# Patient Record
Sex: Female | Born: 1935 | Race: White | Hispanic: No | Smoking: Never smoker
Health system: Southern US, Community
[De-identification: ages and names within clinical notes are randomized; demographics above are authoritative.]

## PROBLEM LIST (undated history)

## (undated) DIAGNOSIS — G35 Multiple sclerosis: Secondary | ICD-10-CM

## (undated) DIAGNOSIS — I10 Essential (primary) hypertension: Secondary | ICD-10-CM

## (undated) DIAGNOSIS — E039 Hypothyroidism, unspecified: Secondary | ICD-10-CM

## (undated) HISTORY — PX: VITRECTOMY: SHX106

---

## 2014-06-23 ENCOUNTER — Ambulatory Visit: Payer: Self-pay | Admitting: Ophthalmology

## 2014-06-23 DIAGNOSIS — I499 Cardiac arrhythmia, unspecified: Secondary | ICD-10-CM

## 2014-06-23 LAB — POTASSIUM: Potassium: 3.7 mmol/L (ref 3.5–5.1)

## 2014-07-08 ENCOUNTER — Ambulatory Visit: Payer: Self-pay | Admitting: Ophthalmology

## 2014-10-25 NOTE — Op Note (Signed)
PATIENT NAME:  Caitlin Colon, Caitlin Colon MR#:  [id number] DATE OF BIRTH:  August 04, 1919  DATE OF PROCEDURE:  07/08/2014  PREPROCEDURE DIAGNOSIS: Epiretinal membrane, right eye.   POSTPROCEDURE DIAGNOSIS: Epiretinal membrane with vitreomacular traction, right eye.   PROCEDURE: A 25-gauge pars plana vitrectomy with triamcinolone, ICG membrane peel, and partial air-fluid exchange with air in the right eye.   ANESTHESIA: MAC with block.   COMPLICATIONS: None.   BLOOD LOSS: Minimal.   DESCRIPTION OF PROCEDURE: The patient was evaluated in the clinic for an epiretinal membrane with vitreomacular traction on the right eye. Risks, benefits, alternatives, and complications were discussed with patient. The patient elected to proceed with vitrectomy and surgery with membrane peeling in the right eye. On the day of surgery, the right eye was marked. Any questions were answered in the holding area. The patient was then brought into the Operating Room and placed under monitored anesthesia care. Then 4 mL of a retrobulbar block consisting of 4% Xylocaine, 0.75% Sensorcaine, and 100 units of Hylenex was injected. The right eye was then prepped and draped in usual sterile fashion. A 25-gauge trocar system was used. The three trocars were placed in the usual position. The inferotemporal one was checked to make sure it was in the vitreous cavity before starting the infusion. A core vitrectomy was performed. The posterior hyaloid was not elevated and so maneuvers to elevate the hyaloid were attempted. The hyaloid was very adherent. Triamcinolone was injected at this point for better staining of the posterior hyaloid and the hyaloid was attempted to be elevated. The hyaloid was successfully elevated nasally; however, temporally over the macula there was still an area of attached hyaloid and so the macular contact lens and forceps were used then to elevated it off the macula. At this point, ICG was then used to stain the retinal surface  and the epiretinal membrane was peeled off the retinal surface. Attention was then drawn back to the periphery. At this point, the hyaloid was just past the arcades temporally and appeared to be down peripherally. A 360 degree peripheral vitrectomy was performed to shorten the vitreous as close as possible. The scleral depressor was used then to examine 360 degrees all the way out to the ora. There were no retinal tears or breaks found. At this point, a partial air-fluid exchange was performed. The eye was left with a partial air bubble. Trocars were removed and were all felt to be watertight. Pressure was good by palpation. Subconjunctival cefuroxime and dexamethasone was injected and the patient was patched and shielded and taken to the recovery area in stable condition.    ____________________________ Princella Pellegrini Duke Salvia, MD jdr:at D: 07/08/2014 09:47:46 ET T: 07/08/2014 14:20:44 ET JOB#: [id number]  cc: Shanda Bumps D. Duke Salvia, MD, <Dictator> Dartha Lodge MD ELECTRONICALLY SIGNED 07/15/2014 10:21

## 2015-12-23 ENCOUNTER — Encounter: Payer: Self-pay | Admitting: Emergency Medicine

## 2015-12-23 ENCOUNTER — Inpatient Hospital Stay
Admission: EM | Admit: 2015-12-23 | Discharge: 2015-12-25 | DRG: 439 | Disposition: A | Discharge status: Other Acute Inpatient Hospital | Payer: Medicare Other | Attending: Internal Medicine | Admitting: Internal Medicine

## 2015-12-23 ENCOUNTER — Emergency Department: Payer: Medicare Other

## 2015-12-23 DIAGNOSIS — E039 Hypothyroidism, unspecified: Secondary | ICD-10-CM | POA: Diagnosis present

## 2015-12-23 DIAGNOSIS — R7401 Elevation of levels of liver transaminase levels: Secondary | ICD-10-CM | POA: Diagnosis present

## 2015-12-23 DIAGNOSIS — R740 Nonspecific elevation of levels of transaminase and lactic acid dehydrogenase [LDH]: Secondary | ICD-10-CM | POA: Diagnosis present

## 2015-12-23 DIAGNOSIS — K858 Other acute pancreatitis without necrosis or infection: Secondary | ICD-10-CM

## 2015-12-23 DIAGNOSIS — K851 Biliary acute pancreatitis without necrosis or infection: Secondary | ICD-10-CM | POA: Diagnosis not present

## 2015-12-23 DIAGNOSIS — K831 Obstruction of bile duct: Secondary | ICD-10-CM

## 2015-12-23 DIAGNOSIS — R748 Abnormal levels of other serum enzymes: Secondary | ICD-10-CM | POA: Diagnosis present

## 2015-12-23 DIAGNOSIS — H9190 Unspecified hearing loss, unspecified ear: Secondary | ICD-10-CM | POA: Diagnosis present

## 2015-12-23 DIAGNOSIS — K859 Acute pancreatitis without necrosis or infection, unspecified: Secondary | ICD-10-CM | POA: Diagnosis present

## 2015-12-23 DIAGNOSIS — G35 Multiple sclerosis: Secondary | ICD-10-CM | POA: Diagnosis present

## 2015-12-23 DIAGNOSIS — I10 Essential (primary) hypertension: Secondary | ICD-10-CM | POA: Diagnosis present

## 2015-12-23 DIAGNOSIS — K8020 Calculus of gallbladder without cholecystitis without obstruction: Secondary | ICD-10-CM | POA: Diagnosis present

## 2015-12-23 DIAGNOSIS — K810 Acute cholecystitis: Secondary | ICD-10-CM

## 2015-12-23 DIAGNOSIS — K8062 Calculus of gallbladder and bile duct with acute cholecystitis without obstruction: Secondary | ICD-10-CM | POA: Diagnosis present

## 2015-12-23 HISTORY — DX: Multiple sclerosis: G35

## 2015-12-23 LAB — CBC
HEMATOCRIT: 39.9 % (ref 35.0–47.0)
Hemoglobin: 13.5 g/dL (ref 12.0–16.0)
MCH: 35.1 pg — AB (ref 26.0–34.0)
MCHC: 33.8 g/dL (ref 32.0–36.0)
MCV: 103.8 fL — AB (ref 80.0–100.0)
Platelets: 169 10*3/uL (ref 150–440)
RBC: 3.84 MIL/uL (ref 3.80–5.20)
RDW: 13 % (ref 11.5–14.5)
WBC: 8.3 10*3/uL (ref 3.6–11.0)

## 2015-12-23 LAB — COMPREHENSIVE METABOLIC PANEL
ALT: 261 U/L — ABNORMAL HIGH (ref 14–54)
AST: 461 U/L — AB (ref 15–41)
Albumin: 4.1 g/dL (ref 3.5–5.0)
Alkaline Phosphatase: 106 U/L (ref 38–126)
Anion gap: 12 (ref 5–15)
BILIRUBIN TOTAL: 2.6 mg/dL — AB (ref 0.3–1.2)
BUN: 27 mg/dL — AB (ref 6–20)
CO2: 25 mmol/L (ref 22–32)
Calcium: 9.2 mg/dL (ref 8.9–10.3)
Chloride: 99 mmol/L — ABNORMAL LOW (ref 101–111)
Creatinine, Ser: 0.89 mg/dL (ref 0.44–1.00)
GFR, EST NON AFRICAN AMERICAN: 54 mL/min — AB (ref >60)
Glucose, Bld: 111 mg/dL — ABNORMAL HIGH (ref 65–99)
Potassium: 4 mmol/L (ref 3.5–5.1)
Sodium: 136 mmol/L (ref 135–145)
Total Protein: 7.2 g/dL (ref 6.5–8.1)

## 2015-12-23 LAB — LIPASE, BLOOD: LIPASE: 1258 U/L — AB (ref 11–51)

## 2015-12-23 MED ORDER — ONDANSETRON HCL 4 MG/2ML IJ SOLN
4.0000 mg | Freq: Once | INTRAMUSCULAR | Status: AC
  Administered 2015-12-23: 4 mg via INTRAVENOUS
  Filled 2015-12-23: qty 2

## 2015-12-23 MED ORDER — DIATRIZOATE MEGLUMINE & SODIUM 66-10 % PO SOLN
30.0000 mL | Freq: Once | ORAL | Status: AC
  Administered 2015-12-23: 30 mL via ORAL

## 2015-12-23 MED ORDER — SODIUM CHLORIDE 0.9 % IV BOLUS (SEPSIS)
1000.0000 mL | Freq: Once | INTRAVENOUS | Status: AC
  Administered 2015-12-23: 1000 mL via INTRAVENOUS

## 2015-12-23 NOTE — ED Provider Notes (Signed)
[facility] Emergency Department Provider Note  Time seen: 8:36 PM  I have reviewed the triage vital signs and the nursing notes.   HISTORY  Chief Complaint Emesis and Nausea    HPI Caitlin Colon is a 82 y.o. female with a past medical history of MS who presents to the emergency department for nausea or vomiting. According to the patient and her daughter who is a nurse the patient had lunch today, was complaining of nausea afterwards. The patient states she was shaking and had one episode of vomiting. Patient states she actually fell conservatively better after vomiting. States it is been several days since her last bowel movement. Denies any abdominal pain. Does feel like her abdomen is more distended than normal. Denies any black or bloody stool. Denies any dysuria.     Past Medical History  Diagnosis Date  . Multiple sclerosis (HCC)     There are no active problems to display for this patient.   History reviewed. No pertinent past surgical history.  No current outpatient prescriptions on file.  Allergies Review of patient's allergies indicates no known allergies.  No family history on file.  Social History Social History  Substance Use Topics  . Smoking status: Never Smoker   . Smokeless tobacco: None  . Alcohol Use: No    Review of Systems Constitutional: Negative for fever. Cardiovascular: Negative for chest pain. Respiratory: Negative for shortness of breath. Gastrointestinal: Negative for abdominal pain. Positive for vomiting. Negative for diarrhea. Positive constipation. Genitourinary: Negative for dysuria Neurological: Negative for headache 10-point ROS otherwise negative.  ____________________________________________   PHYSICAL EXAM:  VITAL SIGNS: ED Triage Vitals  Enc Vitals Group     BP 12/23/15 1817 122/63 mmHg     Pulse Rate 12/23/15 1817 82     Resp --      Temp 12/23/15 1817 98.4 F (36.9 C)     Temp Source  12/23/15 1817 Oral     SpO2 12/23/15 1817 91 %     Weight 12/23/15 1817 112 lb (50.803 kg)     Height 12/23/15 1817 5' (1.524 m)     Head Cir --      Peak Flow --      Pain Score 12/23/15 1819 0     Pain Loc --      Pain Edu? --      Excl. in GC? --     Constitutional: Alert and oriented. Well appearing and in no distress. Eyes: Normal exam ENT   Head: Normocephalic and atraumatic   Mouth/Throat: Mucous membranes are moist. Cardiovascular: Normal rate, regular rhythm. No murmur Respiratory: Normal respiratory effort without tachypnea nor retractions. Breath sounds are clear and equal bilaterally. No wheezes/rales/rhonchi. Gastrointestinal: Soft and nontender. No distention.   Musculoskeletal: Nontender with normal range of motion in all extremities.  Neurologic:  Normal speech and language. No gross focal neurologic deficits  Skin:  Skin is warm, dry and intact.  Psychiatric: Mood and affect are normal.   ____________________________________________   RADIOLOGY  Ultrasound pending  ____________________________________________    INITIAL IMPRESSION / ASSESSMENT AND PLAN / ED COURSE  Pertinent labs & imaging results that were available during my care of the patient were reviewed by me and considered in my medical decision making (see chart for details).  Patient presents to the emergency department one episode of vomiting. Nausea. Denies abdominal pain. Patient states she has been shaking today. Patient's labs have resulted showing elevated lipase of 1258, elevated LFTs. Labs  are consistent with possible choledocholithiasis. We will proceed with an ultrasound to further evaluate. Patient is agreeable to plan.  Ultrasound pending, patient care signed out to oncoming physician.  ____________________________________________   FINAL CLINICAL IMPRESSION(S) / ED DIAGNOSES  Pancreatitis Choledocholithiasis   Minna Antis, MD 12/24/15 2256

## 2015-12-23 NOTE — ED Notes (Addendum)
C/O nausea after lunch today.  Also vomiting x 1 for large amount.  Also c/o shaking during episode.

## 2015-12-24 ENCOUNTER — Encounter: Payer: Self-pay | Admitting: Internal Medicine

## 2015-12-24 DIAGNOSIS — I10 Essential (primary) hypertension: Secondary | ICD-10-CM | POA: Diagnosis present

## 2015-12-24 DIAGNOSIS — E039 Hypothyroidism, unspecified: Secondary | ICD-10-CM | POA: Diagnosis present

## 2015-12-24 DIAGNOSIS — K810 Acute cholecystitis: Secondary | ICD-10-CM | POA: Diagnosis present

## 2015-12-24 DIAGNOSIS — K8062 Calculus of gallbladder and bile duct with acute cholecystitis without obstruction: Secondary | ICD-10-CM | POA: Diagnosis present

## 2015-12-24 DIAGNOSIS — R7401 Elevation of levels of liver transaminase levels: Secondary | ICD-10-CM | POA: Diagnosis present

## 2015-12-24 DIAGNOSIS — R740 Nonspecific elevation of levels of transaminase and lactic acid dehydrogenase [LDH]: Secondary | ICD-10-CM

## 2015-12-24 DIAGNOSIS — K8020 Calculus of gallbladder without cholecystitis without obstruction: Secondary | ICD-10-CM | POA: Diagnosis present

## 2015-12-24 DIAGNOSIS — G35 Multiple sclerosis: Secondary | ICD-10-CM | POA: Diagnosis present

## 2015-12-24 DIAGNOSIS — R748 Abnormal levels of other serum enzymes: Secondary | ICD-10-CM | POA: Diagnosis present

## 2015-12-24 DIAGNOSIS — K859 Acute pancreatitis without necrosis or infection, unspecified: Secondary | ICD-10-CM | POA: Diagnosis present

## 2015-12-24 DIAGNOSIS — H9190 Unspecified hearing loss, unspecified ear: Secondary | ICD-10-CM | POA: Diagnosis present

## 2015-12-24 DIAGNOSIS — K851 Biliary acute pancreatitis without necrosis or infection: Secondary | ICD-10-CM | POA: Diagnosis present

## 2015-12-24 LAB — URINALYSIS COMPLETE WITH MICROSCOPIC (ARMC ONLY)
Bilirubin Urine: NEGATIVE
Glucose, UA: NEGATIVE mg/dL
KETONES UR: NEGATIVE mg/dL
LEUKOCYTES UA: NEGATIVE
NITRITE: NEGATIVE
PH: 6 (ref 5.0–8.0)
PROTEIN: 30 mg/dL — AB
SPECIFIC GRAVITY, URINE: 1.012 (ref 1.005–1.030)

## 2015-12-24 LAB — CBC
HCT: 33.2 % — ABNORMAL LOW (ref 35.0–47.0)
Hemoglobin: 11.5 g/dL — ABNORMAL LOW (ref 12.0–16.0)
MCH: 35.1 pg — AB (ref 26.0–34.0)
MCHC: 34.6 g/dL (ref 32.0–36.0)
MCV: 101.5 fL — AB (ref 80.0–100.0)
PLATELETS: 158 10*3/uL (ref 150–440)
RBC: 3.27 MIL/uL — ABNORMAL LOW (ref 3.80–5.20)
RDW: 13.3 % (ref 11.5–14.5)
WBC: 14.7 10*3/uL — ABNORMAL HIGH (ref 3.6–11.0)

## 2015-12-24 LAB — COMPREHENSIVE METABOLIC PANEL
ALT: 320 U/L — ABNORMAL HIGH (ref 14–54)
AST: 378 U/L — ABNORMAL HIGH (ref 15–41)
Albumin: 3.4 g/dL — ABNORMAL LOW (ref 3.5–5.0)
Alkaline Phosphatase: 106 U/L (ref 38–126)
Anion gap: 8 (ref 5–15)
BILIRUBIN TOTAL: 3.6 mg/dL — AB (ref 0.3–1.2)
BUN: 23 mg/dL — ABNORMAL HIGH (ref 6–20)
CHLORIDE: 103 mmol/L (ref 101–111)
CO2: 27 mmol/L (ref 22–32)
Calcium: 8.3 mg/dL — ABNORMAL LOW (ref 8.9–10.3)
Creatinine, Ser: 0.91 mg/dL (ref 0.44–1.00)
GFR, EST NON AFRICAN AMERICAN: 53 mL/min — AB (ref >60)
Glucose, Bld: 111 mg/dL — ABNORMAL HIGH (ref 65–99)
POTASSIUM: 3.8 mmol/L (ref 3.5–5.1)
Sodium: 138 mmol/L (ref 135–145)
TOTAL PROTEIN: 6.1 g/dL — AB (ref 6.5–8.1)

## 2015-12-24 LAB — LIPASE, BLOOD: LIPASE: 152 U/L — AB (ref 11–51)

## 2015-12-24 MED ORDER — MORPHINE SULFATE (PF) 2 MG/ML IV SOLN: 2.0000 mg | INTRAVENOUS | Status: DC | PRN

## 2015-12-24 MED ORDER — METRONIDAZOLE IN NACL 5-0.79 MG/ML-% IV SOLN
500.0000 mg | Freq: Once | INTRAVENOUS | Status: AC
  Administered 2015-12-24: 500 mg via INTRAVENOUS
  Filled 2015-12-24: qty 100

## 2015-12-24 MED ORDER — ENOXAPARIN SODIUM 30 MG/0.3ML ~~LOC~~ SOLN
30.0000 mg | SUBCUTANEOUS | Status: DC
  Administered 2015-12-24: 30 mg via SUBCUTANEOUS
  Filled 2015-12-24: qty 0.3

## 2015-12-24 MED ORDER — ONDANSETRON HCL 4 MG PO TABS: 4.0000 mg | ORAL_TABLET | Freq: Four times a day (QID) | ORAL | Status: DC | PRN

## 2015-12-24 MED ORDER — METRONIDAZOLE IN NACL 5-0.79 MG/ML-% IV SOLN
500.0000 mg | Freq: Three times a day (TID) | INTRAVENOUS | Status: DC
  Administered 2015-12-24 – 2015-12-25 (×5): 500 mg via INTRAVENOUS
  Filled 2015-12-24 (×7): qty 100

## 2015-12-24 MED ORDER — ONDANSETRON HCL 4 MG/2ML IJ SOLN: 4.0000 mg | Freq: Four times a day (QID) | INTRAMUSCULAR | Status: DC | PRN

## 2015-12-24 MED ORDER — CIPROFLOXACIN IN D5W 400 MG/200ML IV SOLN
400.0000 mg | INTRAVENOUS | Status: DC
  Administered 2015-12-24 – 2015-12-25 (×2): 400 mg via INTRAVENOUS
  Filled 2015-12-24 (×2): qty 200

## 2015-12-24 MED ORDER — SODIUM CHLORIDE 0.9 % IV SOLN
INTRAVENOUS | Status: AC
  Administered 2015-12-24: 04:00:00 via INTRAVENOUS

## 2015-12-24 MED ORDER — ENOXAPARIN SODIUM 40 MG/0.4ML ~~LOC~~ SOLN: 40.0000 mg | SUBCUTANEOUS | Status: DC

## 2015-12-24 MED ORDER — CIPROFLOXACIN IN D5W 400 MG/200ML IV SOLN
400.0000 mg | Freq: Once | INTRAVENOUS | Status: AC
  Administered 2015-12-24: 400 mg via INTRAVENOUS
  Filled 2015-12-24: qty 200

## 2015-12-24 NOTE — Progress Notes (Signed)
Pharmacy Antibiotic Note  Caitlin Colon is a 82 y.o. female admitted on 12/23/2015 with intra-abdominal infection.  Pharmacy has been consulted for ciprofloxacin dosing.  Plan: Ciprofloxacin 400 mg IV q 24 hours ordered.  Metronidazole 500 mg q 8 hours ordered by provider.   Height: 5' (152.4 cm) Weight: 112 lb (50.803 kg) IBW/kg (Calculated) : 45.5  Temp (24hrs), Avg:98.4 F (36.9 C), Min:98.4 F (36.9 C), Max:98.4 F (36.9 C)   Recent Labs Lab 12/23/15 1824  WBC 8.3  CREATININE 0.89    Estimated Creatinine Clearance: 28.4 mL/min (by C-G formula based on Cr of 0.89).    No Known Allergies  Antimicrobials this admission: ciprofloxacin  >>  metronidazole  >>   Dose adjustments this admission:   Microbiology results:   Thank you for allowing pharmacy to be a part of this patient's care.  Teofilo Lupinacci S 12/24/2015 3:27 AM

## 2015-12-24 NOTE — H&P (Addendum)
[facility] -  at [facility]   PATIENT NAME: Caitlin Colon    MR#:  [id number]  DATE OF BIRTH:  [birth year]  DATE OF ADMISSION:  12/23/2015  PRIMARY CARE PHYSICIAN: No primary care provider on file.   REQUESTING/REFERRING PHYSICIAN: Manson Passey, MD  CHIEF COMPLAINT:   Chief Complaint  Patient presents with  . Emesis  . Nausea    HISTORY OF PRESENT ILLNESS:  Caitlin Colon  is a 82 y.o. female who presents with An episode of nausea, vomiting, and shakes earlier today. She has not had any recurrence of symptoms since that time, but there were severe enough at that time that she came to the ED for evaluation. Here she was found to have an elevated lipase around 1200, elevated transaminases. Ultrasound of her abdomen showed cholelithiasis with sludge. Hospitals were called for admission for pancreatitis with suspected choledocholithiasis.  PAST MEDICAL HISTORY:   Past Medical History  Diagnosis Date  . Multiple sclerosis (HCC)     PAST SURGICAL HISTORY:   Past Surgical History  Procedure Laterality Date  . Vitrectomy      SOCIAL HISTORY:   Social History  Substance Use Topics  . Smoking status: Never Smoker   . Smokeless tobacco: Not on file  . Alcohol Use: No    FAMILY HISTORY:  No family history on file.  DRUG ALLERGIES:  No Known Allergies  MEDICATIONS AT HOME:   Prior to Admission medications   Not on File    REVIEW OF SYSTEMS:  Review of Systems  Constitutional: Positive for chills. Negative for fever, weight loss and malaise/fatigue.  HENT: Negative for ear pain, hearing loss and tinnitus.   Eyes: Negative for blurred vision, double vision, pain and redness.  Respiratory: Negative for cough, hemoptysis and shortness of breath.   Cardiovascular: Negative for chest pain, palpitations, orthopnea and leg swelling.  Gastrointestinal: Positive for nausea, vomiting and abdominal pain. Negative for diarrhea and constipation.   Genitourinary: Negative for dysuria, frequency and hematuria.  Musculoskeletal: Negative for back pain, joint pain and neck pain.  Skin:       No acne, rash, or lesions  Neurological: Negative for dizziness, tremors, focal weakness and weakness.  Endo/Heme/Allergies: Negative for polydipsia. Does not bruise/bleed easily.  Psychiatric/Behavioral: Negative for depression. The patient is not nervous/anxious and does not have insomnia.      VITAL SIGNS:   Filed Vitals:   12/23/15 2330 12/24/15 0000 12/24/15 0030 12/24/15 0105  BP: 116/70 123/60 124/63 119/77  Pulse: 68 70 65 70  Temp:      TempSrc:      Resp:    17  Height:      Weight:      SpO2: 87% 92% 90% 98%   Wt Readings from Last 3 Encounters:  12/23/15 50.803 kg (112 lb)    PHYSICAL EXAMINATION:  Physical Exam  Vitals reviewed. Constitutional: She is oriented to person, place, and time. She appears well-developed and well-nourished. No distress.  HENT:  Head: Normocephalic and atraumatic.  Mouth/Throat: Oropharynx is clear and moist.  Eyes: Conjunctivae and EOM are normal. Pupils are equal, round, and reactive to light. No scleral icterus.  Neck: Normal range of motion. Neck supple. No JVD present. No thyromegaly present.  Cardiovascular: Normal rate, regular rhythm and intact distal pulses.  Exam reveals no gallop and no friction rub.   Murmur (2/6 systolic murmur) heard. Respiratory: Effort normal and breath sounds normal. No respiratory distress. She has no wheezes. She has  no rales.  GI: Soft. Bowel sounds are normal. She exhibits no distension. There is no tenderness.  Musculoskeletal: Normal range of motion. She exhibits no edema.  No arthritis, no gout  Lymphadenopathy:    She has no cervical adenopathy.  Neurological: She is alert and oriented to person, place, and time. No cranial nerve deficit.  No dysarthria, no aphasia  Skin: Skin is warm and dry. No rash noted. No erythema.  Psychiatric: She has a  normal mood and affect. Her behavior is normal. Judgment and thought content normal.    LABORATORY PANEL:   CBC  Recent Labs Lab 12/23/15 1824  WBC 8.3  HGB 13.5  HCT 39.9  PLT 169   ------------------------------------------------------------------------------------------------------------------  Chemistries   Recent Labs Lab 12/23/15 1824  NA 136  K 4.0  CL 99*  CO2 25  GLUCOSE 111*  BUN 27*  CREATININE 0.89  CALCIUM 9.2  AST 461*  ALT 261*  ALKPHOS 106  BILITOT 2.6*   ------------------------------------------------------------------------------------------------------------------  Cardiac Enzymes No results for input(s): TROPONINI in the last 168 hours. ------------------------------------------------------------------------------------------------------------------  RADIOLOGY:  US Abdomen Limited Ruq  12/24/2015  CLINICAL DATA:  82 year old female with elevated LFTs and line pains with vomiting. EXAM: US ABDOMEN LIMITED - RIGHT UPPER QUADRANT COMPARISON:  None. FINDINGS: Gallbladder: There is sludge and stones within the gallbladder. There are two stones within the gallbladder measuring up to 2 cm. The gallbladder is distended. There is diffuse thickening and edema of the gallbladder wall. An area of soft tissue thickening and edema at the gallbladder fundus may folded gallbladder lumen, however complex collection adjacent to the gallbladder and therefore possibly of perforation and pericholecystic collection is not excluded. CT with contrast may provide better evaluation if clinically indicated. Common bile duct: Diameter: 5 mm Liver: No focal lesion identified. Within normal limits in parenchymal echogenicity. IMPRESSION: Cholelithiasis with findings concerning for acute cholecystitis. A complex echogenic area adjacent the gallbladder fundus may represent folded portion of the gallbladder. A pericholecystic collection is not excluded. Clinical correlation is  recommended. CT with IV contrast may provide additional information. Electronically Signed   By: Elgie Collard M.D.   On: 12/24/2015 00:05    EKG:   Orders placed or performed in visit on 06/23/14  . EKG 12-Lead    IMPRESSION AND PLAN:  Principal Problem:   Pancreatitis - likely related to her choledocholithiasis. Ultrasound did not show a dilated common bile duct, though she did have a fair amount of biliary sludge in her gallbladder as well as stones, and this could potentially be contributing to an exacerbating her problem. We will keep her nothing by mouth for now, recheck her lipase and transaminases in the morning. We will consult gastroenterology. Active Problems:   Cholelithiasis - consult GI as above, patient may need ERCP. Consult general surgery as well. Some evidence on ultrasound of cholecystitis, antibiotics started in the ED and continued on admission.   Transaminitis - likely related to the above problems, consults and treatment as above, monitor transaminases, avoid hepatotoxins.   Multiple sclerosis (HCC) - currently stable, monitor closely. Home meds will need to be reconciled by pharmacy and reordered once they are.   HTN - Continue home meds once reconciled  All the records are reviewed and case discussed with ED provider. Management plans discussed with the patient and/or family.  DVT PROPHYLAXIS: SubQ lovenox  GI PROPHYLAXIS: None  ADMISSION STATUS: Inpatient  CODE STATUS: Full code ordered for now, patient has living will but family member  present in ED is not HCPOA.  This document will be available later this morning, at which time code status can be readdressed as necessary. Code Status History    This patient does not have a recorded code status. Please follow your organizational policy for patients in this situation.    Advance Directive Documentation        Most Recent Value   Type of Advance Directive  Healthcare Power of Attorney, Living will    Pre-existing out of facility DNR order (yellow form or pink MOST form)     "MOST" Form in Place?        TOTAL TIME TAKING CARE OF THIS PATIENT: 45 minutes.    Adama Ivins FIELDING 12/24/2015, 1:51 AM  [facility]  Office  [phone]  CC: Primary care physician; No primary care provider on file.

## 2015-12-24 NOTE — Care Management (Signed)
Patient admitted from home with pancreatitis.  Patient lives at [facility].  Adult son at bedside.  Patient states that a nurse comes to her apartment 3 times a day to check on her.  Patient uses a walker for ambulation at the facility, and a WC for when she travels outside of the facility.  Son provides transportation when necessary.  Patient obtains her medications from [facility], and [facility] for one time scripts. RNCM available for discharge planning if needed.

## 2015-12-24 NOTE — Progress Notes (Signed)
[facility] [place] at [facility]   PATIENT NAME: Caitlin Colon    MR#:  [id number]  DATE OF BIRTH:  January 02, 1920  SUBJECTIVE:  CHIEF COMPLAINT:   Chief Complaint  Patient presents with  . Emesis  . Nausea   Came with nausea- now feels completely fine, no pain. Found to have sludge in gall bladder and acute pancreatitis.  REVIEW OF SYSTEMS:  CONSTITUTIONAL: No fever, fatigue or weakness.  EYES: No blurred or double vision.  EARS, NOSE, AND THROAT: No tinnitus or ear pain.  RESPIRATORY: No cough, shortness of breath, wheezing or hemoptysis.  CARDIOVASCULAR: No chest pain, orthopnea, edema.  GASTROINTESTINAL: No nausea, vomiting, diarrhea or abdominal pain.  GENITOURINARY: No dysuria, hematuria.  ENDOCRINE: No polyuria, nocturia,  HEMATOLOGY: No anemia, easy bruising or bleeding SKIN: No rash or lesion. MUSCULOSKELETAL: No joint pain or arthritis.   NEUROLOGIC: No tingling, numbness, weakness.  PSYCHIATRY: No anxiety or depression.   ROS  DRUG ALLERGIES:  No Known Allergies  VITALS:  Blood pressure 118/66, pulse 63, temperature 97.9 F (36.6 C), temperature source Oral, resp. rate 20, height 5' (1.524 m), weight 51.71 kg (114 lb), SpO2 94 %.  PHYSICAL EXAMINATION:  GENERAL:  82 y.o.-year-old patient lying in the bed with no acute distress.  EYES: Pupils equal, round, reactive to light and accommodation. No scleral icterus. Extraocular muscles intact.  HEENT: Head atraumatic, normocephalic. Oropharynx and nasopharynx clear.  NECK:  Supple, no jugular venous distention. No thyroid enlargement, no tenderness.  LUNGS: Normal breath sounds bilaterally, no wheezing, rales,rhonchi or crepitation. No use of accessory muscles of respiration.  CARDIOVASCULAR: S1, S2 normal. Systolic murmur present ,no rubs, or gallops.  ABDOMEN: Soft, nontender, nondistended. Bowel sounds present. No organomegaly or mass.  EXTREMITIES: No pedal edema, cyanosis, or clubbing.   NEUROLOGIC: Cranial nerves II through XII are intact. Muscle strength 5/5 in all extremities. Sensation intact. Gait not checked.  PSYCHIATRIC: The patient is alert and oriented x 3.  SKIN: No obvious rash, lesion, or ulcer.   Physical Exam LABORATORY PANEL:   CBC  Recent Labs Lab 12/24/15 0509  WBC 14.7*  HGB 11.5*  HCT 33.2*  PLT 158   ------------------------------------------------------------------------------------------------------------------  Chemistries   Recent Labs Lab 12/24/15 0509  NA 138  K 3.8  CL 103  CO2 27  GLUCOSE 111*  BUN 23*  CREATININE 0.91  CALCIUM 8.3*  AST 378*  ALT 320*  ALKPHOS 106  BILITOT 3.6*   ------------------------------------------------------------------------------------------------------------------  Cardiac Enzymes No results for input(s): TROPONINI in the last 168 hours. ------------------------------------------------------------------------------------------------------------------  RADIOLOGY:  US Abdomen Limited Ruq  12/24/2015  CLINICAL DATA:  82 year old female with elevated LFTs and line pains with vomiting. EXAM: US ABDOMEN LIMITED - RIGHT UPPER QUADRANT COMPARISON:  None. FINDINGS: Gallbladder: There is sludge and stones within the gallbladder. There are two stones within the gallbladder measuring up to 2 cm. The gallbladder is distended. There is diffuse thickening and edema of the gallbladder wall. An area of soft tissue thickening and edema at the gallbladder fundus may folded gallbladder lumen, however complex collection adjacent to the gallbladder and therefore possibly of perforation and pericholecystic collection is not excluded. CT with contrast may provide better evaluation if clinically indicated. Common bile duct: Diameter: 5 mm Liver: No focal lesion identified. Within normal limits in parenchymal echogenicity. IMPRESSION: Cholelithiasis with findings concerning for acute cholecystitis. A complex echogenic  area adjacent the gallbladder fundus may represent folded portion of the gallbladder. A pericholecystic collection is not excluded.  Clinical correlation is recommended. CT with IV contrast may provide additional information. Electronically Signed   By: Elgie Collard M.D.   On: 12/24/2015 00:05    ASSESSMENT AND PLAN:   Principal Problem:   Pancreatitis Active Problems:   Cholelithiasis   Transaminitis   Multiple sclerosis (HCC)  * Acute Pancreatitis - likely related to her choledocholithiasis. Ultrasound did not show a dilated common bile duct, though she did have a fair amount of biliary sludge in her gallbladder as well as stones, and this could potentially be contributing to an exacerbating her problem.    Kept NPO with supportive care.   IV fluids.   Lipase improved.   Appreciated surgical consult.   Gi consult not available, but as pt is feeling better- we can monitor and may not need ERCP.  * Cholelithiasis -    Appreciated Surgery consult- suggested to avoid surgery and call Gi.   Cont to monitor.  * Transaminitis - likely related to the above problems, consults and treatment as above, monitor transaminases, avoid hepatotoxins.  * Multiple sclerosis (HCC) - currently stable, monitor closely.   * HTN - Continue home meds once reconciled   All the records are reviewed and case discussed with Care Management/Social Workerr. Management plans discussed with the patient, family and they are in agreement.  CODE STATUS: Full.  TOTAL TIME TAKING CARE OF THIS PATIENT: 35 minutes.     POSSIBLE D/C IN 1-2 DAYS, DEPENDING ON CLINICAL CONDITION. Upgrading the diet today. May d/c tomorrow.  Altamese Dilling M.D on 12/24/2015   Between 7am to 6pm - Pager - [phone]  After 6pm go to [email or web address] - Social research officer, government  [facility]  Office  [phone]  CC: Primary care physician; No primary care provider on file.  Note: This dictation was  prepared with Dragon dictation along with smaller phrase technology. Any transcriptional errors that result from this process are unintentional.

## 2015-12-24 NOTE — Consult Note (Signed)
Patient ID: Caitlin Colon, female   DOB: Nov 10, 1919, 82 y.o.   MRN: [id number]  CC: Nausea  HPI Caitlin Colon is a 82 y.o. female currently admitted to the medicine service for gallstone pancreatitis. Surgery consult requested by Dr.Vachhani for the pancreatitis. Patient is very pleasant but is also very hard of hearing and does not give a clear history. The majority of the history comes from the patient's daughter. Nausea and vomiting started yesterday after lunch. She denies any real pain. She denies any fevers, chills, chest pain, shortness of breath, dysuria, constipation, diarrhea.  HPI  Past Medical History  Diagnosis Date  . Multiple sclerosis [facility])     Past Surgical History  Procedure Laterality Date  . Vitrectomy      No family history on file. Patient and patient's daughter deny any family history of cardiac disease, malignancies, endocrine disorders.  Social History Social History  Substance Use Topics  . Smoking status: Never Smoker   . Smokeless tobacco: None  . Alcohol Use: No    No Known Allergies  Current Facility-Administered Medications  Medication Dose Route Frequency Provider Last Rate Last Dose  . 0.9 %  sodium chloride infusion   Intravenous Continuous Oralia Manis, MD 100 mL/hr at 12/24/15 0420    . ciprofloxacin (CIPRO) IVPB 400 mg  400 mg Intravenous Q24H Oralia Manis, MD      . enoxaparin (LOVENOX) injection 30 mg  30 mg Subcutaneous Q24H Oralia Manis, MD      . metroNIDAZOLE (FLAGYL) IVPB 500 mg  500 mg Intravenous Q8H Oralia Manis, MD      . morphine 2 MG/ML injection 2 mg  2 mg Intravenous Q4H PRN Oralia Manis, MD      . ondansetron [facility]) tablet 4 mg  4 mg Oral Q6H PRN Oralia Manis, MD       Or  . ondansetron [facility]) injection 4 mg  4 mg Intravenous Q6H PRN Oralia Manis, MD         Review of Systems A Multi-point review of systems was asked and was negative except for the findings documented in the history of present illness  Physical  Exam Blood pressure 139/65, pulse 77, temperature 98.2 F (36.8 C), temperature source Oral, resp. rate 20, height 5' (1.524 m), weight 51.71 kg (114 lb), SpO2 98 %. CONSTITUTIONAL: Resting in bed in no acute distress. EYES: Pupils are equal, round, and reactive to light, Sclera are non-icteric. EARS, NOSE, MOUTH AND THROAT: The oropharynx is clear. The oral mucosa is pink and moist. Hearing is intact to voice. LYMPH NODES:  Lymph nodes in the neck are normal. RESPIRATORY:  Lungs are clear. There is normal respiratory effort, with equal breath sounds bilaterally, and without pathologic use of accessory muscles. CARDIOVASCULAR: Heart is regular without murmurs, gallops, or rubs. GI: The abdomen is soft, nontender, and nondistended. There are no palpable masses. There is no hepatosplenomegaly. There are normal bowel sounds in all quadrants. GU: Rectal deferred.   MUSCULOSKELETAL: Normal muscle strength and tone. No cyanosis or edema.   SKIN: Turgor is good and there are no pathologic skin lesions or ulcers. NEUROLOGIC: Motor and sensation is grossly normal. Cranial nerves are grossly intact. PSYCH:  Oriented to person, place and time. Affect is normal.  Data Reviewed Images and labs reviewed. Patient does have a leukocytosis of 14.7. Also has an elevated bilirubin of 3.6. Elevated lipase on admission but not rechecked today. Ultrasound admission shows numerous gallstones and sludge. Also appears to show some edema  of the gallbladder fundus which could represent cholecystitis. No dilated duct or obvious choledocholithiasis seen on her ultrasound. No CT obtained. I have personally reviewed the patient's imaging, laboratory findings and medical records.    Assessment    Gallstone pancreatitis    Plan    82 year old female with gallstone pancreatitis. Agree with GI consult and conservative treatment. Patient may require an ERCP. Discussed with the patient and the daughter that surgery for her  would be a last resort. The primary treatment plan will be time, fluid, antibiotics. Should she need further treatment for cholecystitis Recommendation be a percutaneous cholecystostomy drain. However, ERCP may be the next treatment of choice. Could also consider cross-sectional imaging that is yet to be obtained. Surgery will continue to follow with you.     Time spent with the patient was 30 minutes, with more than 50% of the time spent in face-to-face education, counseling and care coordination.     Ricarda Frame, MD FACS General Surgeon 12/24/2015, 10:04 AM

## 2015-12-25 ENCOUNTER — Inpatient Hospital Stay (HOSPITAL_COMMUNITY)
Admission: AD | Admit: 2015-12-25 | Discharge: 2015-12-26 | DRG: 439 | Disposition: A | Discharge status: Other Acute Inpatient Hospital | Payer: Medicare Other | Source: Other Acute Inpatient Hospital | Attending: Internal Medicine | Admitting: Internal Medicine

## 2015-12-25 ENCOUNTER — Inpatient Hospital Stay: Payer: Medicare Other

## 2015-12-25 DIAGNOSIS — Z79899 Other long term (current) drug therapy: Secondary | ICD-10-CM | POA: Diagnosis not present

## 2015-12-25 DIAGNOSIS — E039 Hypothyroidism, unspecified: Secondary | ICD-10-CM | POA: Diagnosis present

## 2015-12-25 DIAGNOSIS — K8042 Calculus of bile duct with acute cholecystitis without obstruction: Secondary | ICD-10-CM | POA: Diagnosis present

## 2015-12-25 DIAGNOSIS — G35 Multiple sclerosis: Secondary | ICD-10-CM | POA: Diagnosis present

## 2015-12-25 DIAGNOSIS — K8020 Calculus of gallbladder without cholecystitis without obstruction: Secondary | ICD-10-CM | POA: Diagnosis present

## 2015-12-25 DIAGNOSIS — K851 Biliary acute pancreatitis without necrosis or infection: Principal | ICD-10-CM | POA: Diagnosis present

## 2015-12-25 DIAGNOSIS — R7401 Elevation of levels of liver transaminase levels: Secondary | ICD-10-CM | POA: Diagnosis present

## 2015-12-25 DIAGNOSIS — R740 Nonspecific elevation of levels of transaminase and lactic acid dehydrogenase [LDH]: Secondary | ICD-10-CM

## 2015-12-25 DIAGNOSIS — I10 Essential (primary) hypertension: Secondary | ICD-10-CM | POA: Diagnosis present

## 2015-12-25 LAB — COMPREHENSIVE METABOLIC PANEL
ALT: 180 U/L — ABNORMAL HIGH (ref 14–54)
AST: 146 U/L — AB (ref 15–41)
Albumin: 2.9 g/dL — ABNORMAL LOW (ref 3.5–5.0)
Alkaline Phosphatase: 93 U/L (ref 38–126)
Anion gap: 8 (ref 5–15)
BILIRUBIN TOTAL: 4.6 mg/dL — AB (ref 0.3–1.2)
BUN: 19 mg/dL (ref 6–20)
CALCIUM: 7.7 mg/dL — AB (ref 8.9–10.3)
CO2: 23 mmol/L (ref 22–32)
CREATININE: 0.85 mg/dL (ref 0.44–1.00)
Chloride: 106 mmol/L (ref 101–111)
GFR, EST NON AFRICAN AMERICAN: 57 mL/min — AB (ref >60)
GLUCOSE: 92 mg/dL (ref 65–99)
Potassium: 3.5 mmol/L (ref 3.5–5.1)
Sodium: 137 mmol/L (ref 135–145)
Total Protein: 5.5 g/dL — ABNORMAL LOW (ref 6.5–8.1)

## 2015-12-25 LAB — LIPASE, BLOOD: Lipase: 19 U/L (ref 11–51)

## 2015-12-25 LAB — CBC
HEMATOCRIT: 30.5 % — AB (ref 35.0–47.0)
Hemoglobin: 10.4 g/dL — ABNORMAL LOW (ref 12.0–16.0)
MCH: 35.1 pg — AB (ref 26.0–34.0)
MCHC: 34.3 g/dL (ref 32.0–36.0)
MCV: 102.4 fL — AB (ref 80.0–100.0)
Platelets: 128 10*3/uL — ABNORMAL LOW (ref 150–440)
RBC: 2.98 MIL/uL — ABNORMAL LOW (ref 3.80–5.20)
RDW: 13.3 % (ref 11.5–14.5)
WBC: 7.7 10*3/uL (ref 3.6–11.0)

## 2015-12-25 LAB — BILIRUBIN, DIRECT: Bilirubin, Direct: 3.1 mg/dL — ABNORMAL HIGH (ref 0.1–0.5)

## 2015-12-25 MED ORDER — MORPHINE SULFATE (PF) 2 MG/ML IV SOLN: 2.0000 mg | INTRAVENOUS | Status: DC | PRN

## 2015-12-25 MED ORDER — METRONIDAZOLE IN NACL 5-0.79 MG/ML-% IV SOLN: 500.0000 mg | Freq: Three times a day (TID) | INTRAVENOUS | Status: DC

## 2015-12-25 MED ORDER — FAMOTIDINE IN NACL 20-0.9 MG/50ML-% IV SOLN
20.0000 mg | Freq: Every day | INTRAVENOUS | Status: DC
  Administered 2015-12-25: 20 mg via INTRAVENOUS
  Filled 2015-12-25 (×2): qty 50

## 2015-12-25 MED ORDER — CIPROFLOXACIN IN D5W 400 MG/200ML IV SOLN: 400.0000 mg | INTRAVENOUS | Status: DC

## 2015-12-25 MED ORDER — GADOBENATE DIMEGLUMINE 529 MG/ML IV SOLN
10.0000 mL | Freq: Once | INTRAVENOUS | Status: AC | PRN
Start: 2015-12-25 — End: 2015-12-25
  Administered 2015-12-25: 10 mL via INTRAVENOUS

## 2015-12-25 MED ORDER — ONDANSETRON HCL 4 MG PO TABS: 4.0000 mg | ORAL_TABLET | Freq: Four times a day (QID) | ORAL | Status: DC | PRN

## 2015-12-25 MED ORDER — METOPROLOL TARTRATE 5 MG/5ML IV SOLN
5.0000 mg | Freq: Three times a day (TID) | INTRAVENOUS | Status: DC
  Administered 2015-12-25 – 2015-12-26 (×3): 5 mg via INTRAVENOUS
  Filled 2015-12-25 (×3): qty 5

## 2015-12-25 MED ORDER — ONDANSETRON HCL 4 MG/2ML IJ SOLN: 4.0000 mg | Freq: Four times a day (QID) | INTRAMUSCULAR | Status: DC | PRN

## 2015-12-25 MED ORDER — SODIUM CHLORIDE 0.9 % IV SOLN
INTRAVENOUS | Status: DC
  Administered 2015-12-25: 20:00:00 via INTRAVENOUS

## 2015-12-25 MED ORDER — KCL IN DEXTROSE-NACL 20-5-0.9 MEQ/L-%-% IV SOLN
INTRAVENOUS | Status: DC
  Administered 2015-12-25: 23:00:00 via INTRAVENOUS
  Filled 2015-12-25 (×3): qty 1000

## 2015-12-25 MED ORDER — SODIUM CHLORIDE 0.9% FLUSH
3.0000 mL | Freq: Two times a day (BID) | INTRAVENOUS | Status: DC
  Administered 2015-12-25: 3 mL via INTRAVENOUS

## 2015-12-25 NOTE — Progress Notes (Signed)
Patient being transferred to [facility] room 1.  Report called to [place]. Family aware. Loel Ro, RN 12/25/15 at [phone]

## 2015-12-25 NOTE — Progress Notes (Signed)
[facility] [place] at [facility]   PATIENT NAME: Caitlin Colon    MR#:  [id number]  DATE OF BIRTH:  [birth year]  SUBJECTIVE:  CHIEF COMPLAINT:   Chief Complaint  Patient presents with  . Emesis  . Nausea   Came with nausea- now feels completely fine, no pain. Found to have sludge in gall bladder and acute pancreatitis.   Tolerated liquid diet yesterday and today morning, but her bilirubin is still going up.   So I will do an MRCP today, patient is very upset that she did not able to go today home.  REVIEW OF SYSTEMS:  CONSTITUTIONAL: No fever, fatigue or weakness.  EYES: No blurred or double vision.  EARS, NOSE, AND THROAT: No tinnitus or ear pain.  RESPIRATORY: No cough, shortness of breath, wheezing or hemoptysis.  CARDIOVASCULAR: No chest pain, orthopnea, edema.  GASTROINTESTINAL: No nausea, vomiting, diarrhea or abdominal pain.  GENITOURINARY: No dysuria, hematuria.  ENDOCRINE: No polyuria, nocturia,  HEMATOLOGY: No anemia, easy bruising or bleeding SKIN: No rash or lesion. MUSCULOSKELETAL: No joint pain or arthritis.   NEUROLOGIC: No tingling, numbness, weakness.  PSYCHIATRY: No anxiety or depression.   ROS  DRUG ALLERGIES:  No Known Allergies  VITALS:  Blood pressure 120/72, pulse 69, temperature 98.2 F (36.8 C), temperature source Oral, resp. rate 19, height 5' (1.524 m), weight 51.71 kg (114 lb), SpO2 96 %.  PHYSICAL EXAMINATION:  GENERAL:  82 y.o.-year-old patient lying in the bed with no acute distress.  EYES: Pupils equal, round, reactive to light and accommodation. No scleral icterus. Extraocular muscles intact.  HEENT: Head atraumatic, normocephalic. Oropharynx and nasopharynx clear.  NECK:  Supple, no jugular venous distention. No thyroid enlargement, no tenderness.  LUNGS: Normal breath sounds bilaterally, no wheezing, rales,rhonchi or crepitation. No use of accessory muscles of respiration.  CARDIOVASCULAR: S1, S2 normal. Systolic  murmur present ,no rubs, or gallops.  ABDOMEN: Soft, nontender, nondistended. Bowel sounds present. No organomegaly or mass.  EXTREMITIES: No pedal edema, cyanosis, or clubbing.  NEUROLOGIC: Cranial nerves II through XII are intact. Muscle strength 5/5 in all extremities. Sensation intact. Gait not checked.  PSYCHIATRIC: The patient is alert and oriented x 3.  SKIN: No obvious rash, lesion, or ulcer.   Physical Exam LABORATORY PANEL:   CBC  Recent Labs Lab 12/25/15 0558  WBC 7.7  HGB 10.4*  HCT 30.5*  PLT 128*   ------------------------------------------------------------------------------------------------------------------  Chemistries   Recent Labs Lab 12/25/15 0558  NA 137  K 3.5  CL 106  CO2 23  GLUCOSE 92  BUN 19  CREATININE 0.85  CALCIUM 7.7*  AST 146*  ALT 180*  ALKPHOS 93  BILITOT 4.6*   ------------------------------------------------------------------------------------------------------------------  Cardiac Enzymes No results for input(s): TROPONINI in the last 168 hours. ------------------------------------------------------------------------------------------------------------------  RADIOLOGY:  US Abdomen Limited Ruq  12/24/2015  CLINICAL DATA:  82 year old female with elevated LFTs and line pains with vomiting. EXAM: US ABDOMEN LIMITED - RIGHT UPPER QUADRANT COMPARISON:  None. FINDINGS: Gallbladder: There is sludge and stones within the gallbladder. There are two stones within the gallbladder measuring up to 2 cm. The gallbladder is distended. There is diffuse thickening and edema of the gallbladder wall. An area of soft tissue thickening and edema at the gallbladder fundus may folded gallbladder lumen, however complex collection adjacent to the gallbladder and therefore possibly of perforation and pericholecystic collection is not excluded. CT with contrast may provide better evaluation if clinically indicated. Common bile duct: Diameter: 5 mm Liver: No  focal lesion identified. Within normal limits in parenchymal echogenicity. IMPRESSION: Cholelithiasis with findings concerning for acute cholecystitis. A complex echogenic area adjacent the gallbladder fundus may represent folded portion of the gallbladder. A pericholecystic collection is not excluded. Clinical correlation is recommended. CT with IV contrast may provide additional information. Electronically Signed   By: Elgie Collard M.D.   On: 12/24/2015 00:05    ASSESSMENT AND PLAN:   Principal Problem:   Pancreatitis Active Problems:   Cholelithiasis   Transaminitis   Multiple sclerosis (HCC)  * Acute Pancreatitis - likely related to her choledocholithiasis. Ultrasound did not show a dilated common bile duct, though she did have a fair amount of biliary sludge in her gallbladder as well as stones, and this could potentially be contributing to an exacerbating her problem.    Kept NPO with supportive care.   IV fluids.   Lipase improved.   Appreciated surgical consult.   Gi consult not available, but as pt is feeling better- we can monitor and may not need ERCP.  * Hyperbilirubinemia   Other LFTs are improving.   Patient does not have any pain.   I will get MRCP today.   I spoke to on call surgeon for follow-up on this patient.  * Cholelithiasis -    Appreciated Surgery consult- suggested to avoid surgery and call Gi.   Cont to monitor.  * Transaminitis - likely related to the above problems, consults and treatment as above, monitor transaminases, avoid hepatotoxins.  * Multiple sclerosis (HCC) - currently stable, monitor closely.   * HTN - Continue home meds once reconciled   All the records are reviewed and case discussed with Care Management/Social Workerr. Management plans discussed with the patient, family and they are in agreement.  CODE STATUS: Full.  TOTAL TIME TAKING CARE OF THIS PATIENT: 35 minutes.  I spoke to patient's daughter and son on phone and  informed them about the findings and plan.   POSSIBLE D/C IN 1-2 DAYS, DEPENDING ON CLINICAL CONDITION.   Altamese Dilling M.D on 12/25/2015   Between 7am to 6pm - Pager - [phone]  After 6pm go to [email or web address] - Social research officer, government  [facility]  Office  [phone]  CC: Primary care physician; No primary care provider on file.  Note: This dictation was prepared with Dragon dictation along with smaller phrase technology. Any transcriptional errors that result from this process are unintentional.

## 2015-12-25 NOTE — Discharge Summary (Signed)
[facility] [place] at [facility]   PATIENT NAME: Caitlin Colon    MR#:  [id number]  DATE OF BIRTH:  [birth year]  DATE OF ADMISSION:  12/23/2015 ADMITTING PHYSICIAN: Oralia Manis, MD  DATE OF DISCHARGE: No discharge date for patient encounter.  PRIMARY CARE PHYSICIAN: No primary care provider on file.    ADMISSION DIAGNOSIS:  Acute cholecystitis [K81.0] Other acute pancreatitis [K85.8]  DISCHARGE DIAGNOSIS:  Principal Problem:   Pancreatitis Active Problems:   Cholelithiasis   Transaminitis   Multiple sclerosis (HCC)    CBD stone, Cholecystitis  SECONDARY DIAGNOSIS:   Past Medical History  Diagnosis Date  . Multiple sclerosis [facility])     HOSPITAL COURSE:   * Acute Pancreatitis - likely related to her choledocholithiasis. Ultrasound did not show a dilated common bile duct, though she did have a fair amount of biliary sludge in her gallbladder as well as stones, and this could potentially be contributing to an exacerbating her problem.   Kept NPO with supportive care.  IV fluids.  Lipase improved.  Appreciated surgical consult.  Gi consult not available, but as pt is feeling better- we can monitor and may not need ERCP.  * Hyperbilirubinemia  Other LFTs are improving.  Patient does not have any pain.  I will get MRCP today.  I spoke to on call surgeon for follow-up on this patient.  * Cholelithiasis -   Appreciated Surgery consult- suggested to avoid surgery and call Gi.  Cont to monitor.  * Transaminitis - likely related to the above problems, consults and treatment as above, monitor transaminases, avoid hepatotoxins.  * Multiple sclerosis (HCC) - currently stable, monitor closely.   * HTN - Continue home meds once reconciled  MRCP CONFIRMED 9 MM CBD STONE AND DILATION OF BILIARY TREE, WITH SOME FINDINGS OF CHOLECYSTITIS. NEED ERCP. ACCEPTED BY DR. Bosie Clos AT [facility]. DISCHARGE CONDITIONS:    STABLE.  CONSULTS OBTAINED:  Treatment Team:  Leafy Ro, MD  DRUG ALLERGIES:  No Known Allergies  DISCHARGE MEDICATIONS:   Current Discharge Medication List    START taking these medications   Details  ciprofloxacin (CIPRO) 400 MG/200ML SOLN Inject 200 mLs (400 mg total) into the vein daily. Qty: 2000 mL, Refills: 1    metroNIDAZOLE (FLAGYL) 5-0.79 MG/ML-% IVPB Inject 100 mLs (500 mg total) into the vein every 8 (eight) hours. Qty: 100 mL, Refills: 0    ondansetron (ZOFRAN) 4 MG tablet Take 1 tablet (4 mg total) by mouth every 6 (six) hours as needed for nausea. Qty: 20 tablet, Refills: 0      CONTINUE these medications which have NOT CHANGED   Details  atenolol (TENORMIN) 50 MG tablet Take 50 mg by mouth daily.    levothyroxine (SYNTHROID, LEVOTHROID) 50 MCG tablet Take 50 mcg by mouth daily before breakfast.         DISCHARGE INSTRUCTIONS:    Follow with gi.  If you experience worsening of your admission symptoms, develop shortness of breath, life threatening emergency, suicidal or homicidal thoughts you must seek medical attention immediately by calling 911 or calling your MD immediately  if symptoms less severe.  You Must read complete instructions/literature along with all the possible adverse reactions/side effects for all the Medicines you take and that have been prescribed to you. Take any new Medicines after you have completely understood and accept all the possible adverse reactions/side effects.   Please note  You were cared for by a hospitalist during your  hospital stay. If you have any questions about your discharge medications or the care you received while you were in the hospital after you are discharged, you can call the unit and asked to speak with the hospitalist on call if the hospitalist that took care of you is not available. Once you are discharged, your primary care physician will handle any further medical issues. Please note that NO  REFILLS for any discharge medications will be authorized once you are discharged, as it is imperative that you return to your primary care physician (or establish a relationship with a primary care physician if you do not have one) for your aftercare needs so that they can reassess your need for medications and monitor your lab values.    Today   CHIEF COMPLAINT:   Chief Complaint  Patient presents with  . Emesis  . Nausea    HISTORY OF PRESENT ILLNESS:  Caitlin Colon  is a 82 y.o. female presents with An episode of nausea, vomiting, and shakes earlier today. She has not had any recurrence of symptoms since that time, but there were severe enough at that time that she came to the ED for evaluation. Here she was found to have an elevated lipase around 1200, elevated transaminases. Ultrasound of her abdomen showed cholelithiasis with sludge. Hospitals were called for admission for pancreatitis with suspected choledocholithiasis.    VITAL SIGNS:  Blood pressure 120/72, pulse 69, temperature 98.2 F (36.8 C), temperature source Oral, resp. rate 19, height 5' (1.524 m), weight 51.71 kg (114 lb), SpO2 96 %.  I/O:   Intake/Output Summary (Last 24 hours) at 12/25/15 1832 Last data filed at 12/25/15 1439  Gross per 24 hour  Intake    480 ml  Output    300 ml  Net    180 ml    PHYSICAL EXAMINATION:  GENERAL:  82 y.o.-year-old patient lying in the bed with no acute distress.  EYES: Pupils equal, round, reactive to light and accommodation. No scleral icterus. Extraocular muscles intact.  HEENT: Head atraumatic, normocephalic. Oropharynx and nasopharynx clear.  NECK:  Supple, no jugular venous distention. No thyroid enlargement, no tenderness.  LUNGS: Normal breath sounds bilaterally, no wheezing, rales,rhonchi or crepitation. No use of accessory muscles of respiration.  CARDIOVASCULAR: S1, S2 normal. No murmurs, rubs, or gallops.  ABDOMEN: Soft, non-tender, non-distended. Bowel sounds  present. No organomegaly or mass.  EXTREMITIES: No pedal edema, cyanosis, or clubbing.  NEUROLOGIC: Cranial nerves II through XII are intact. Muscle strength 5/5 in all extremities. Sensation intact. Gait not checked.  PSYCHIATRIC: The patient is alert and oriented x 3.  SKIN: No obvious rash, lesion, or ulcer.   DATA REVIEW:   CBC  Recent Labs Lab 12/25/15 0558  WBC 7.7  HGB 10.4*  HCT 30.5*  PLT 128*    Chemistries   Recent Labs Lab 12/25/15 0558  NA 137  K 3.5  CL 106  CO2 23  GLUCOSE 92  BUN 19  CREATININE 0.85  CALCIUM 7.7*  AST 146*  ALT 180*  ALKPHOS 93  BILITOT 4.6*    Cardiac Enzymes No results for input(s): TROPONINI in the last 168 hours.  Microbiology Results  No results found for this or any previous visit.  RADIOLOGY:  Mr Roe Coombs W/wo Cm/mrcp  12/25/2015  CLINICAL DATA:  Nausea with vomiting and abdominal pain. Acute pancreatitis. Cholelithiasis. EXAM: MRI ABDOMEN WITHOUT AND WITH CONTRAST (INCLUDING MRCP) TECHNIQUE: Multiplanar multisequence MR imaging of the abdomen was performed both before and  after the administration of intravenous contrast. Heavily T2-weighted images of the biliary and pancreatic ducts were obtained, and three-dimensional MRCP images were rendered by post processing. CONTRAST:  55mL MULTIHANCE GADOBENATE DIMEGLUMINE 529 MG/ML IV SOLN COMPARISON:  Ultrasound on 12/23/2015 FINDINGS: Exam partially degraded by respiratory motion artifact. Lower chest:  Tiny right pleural effusion.  Mild cardiomegaly. Hepatobiliary: Evaluation is limited by respiratory motion artifact, however no liver masses are identified. Several gallstones are seen and there is mild gallbladder dilatation and wall thickening. Diffuse biliary ductal dilatation seen with common bile duct measuring 10 mm. At least 2 stones are seen in the distal common bile duct measuring 9-10 mm. Pancreas: Limited evaluation due to respiratory motion artifact. No pancreatic mass or  pancreatic ductal dilatation demonstrated. No significant peripancreatic inflammatory changes or fluid collections identified. No mass, inflammatory changes, or other parenchymal abnormality identified. Spleen:  Within normal limits in size and appearance. Adrenals/Urinary Tract: No masses identified. No evidence of hydronephrosis. Tiny cyst noted in upper pole of right kidney. Stomach/Bowel: Visualized portions within the abdomen are unremarkable. Vascular/Lymphatic: No pathologically enlarged lymph nodes identified. No abdominal aortic aneurysm demonstrated. Other:  None. Musculoskeletal:  No suspicious bone lesions identified. IMPRESSION: Cholelithiasis with associated gallbladder dilatation and wall thickening, suspicious for acute cholecystitis. Consider nuclear medicine hepatobiliary scan for further evaluation if clinically warranted. Diffuse biliary ductal dilatation with common bile duct measuring 10 mm. Choledocholithiasis, with at least 2 stones in distal common bile duct, largest measuring 10 mm. Exam partially degraded by respiratory motion artifact. Tiny right pleural effusion. Electronically Signed   By: Myles Rosenthal M.D.   On: 12/25/2015 16:15   US Abdomen Limited Ruq  12/24/2015  CLINICAL DATA:  82 year old female with elevated LFTs and line pains with vomiting. EXAM: US ABDOMEN LIMITED - RIGHT UPPER QUADRANT COMPARISON:  None. FINDINGS: Gallbladder: There is sludge and stones within the gallbladder. There are two stones within the gallbladder measuring up to 2 cm. The gallbladder is distended. There is diffuse thickening and edema of the gallbladder wall. An area of soft tissue thickening and edema at the gallbladder fundus may folded gallbladder lumen, however complex collection adjacent to the gallbladder and therefore possibly of perforation and pericholecystic collection is not excluded. CT with contrast may provide better evaluation if clinically indicated. Common bile duct: Diameter: 5 mm  Liver: No focal lesion identified. Within normal limits in parenchymal echogenicity. IMPRESSION: Cholelithiasis with findings concerning for acute cholecystitis. A complex echogenic area adjacent the gallbladder fundus may represent folded portion of the gallbladder. A pericholecystic collection is not excluded. Clinical correlation is recommended. CT with IV contrast may provide additional information. Electronically Signed   By: Elgie Collard M.D.   On: 12/24/2015 00:05    EKG:   Orders placed or performed in visit on 06/23/14  . EKG 12-Lead      Management plans discussed with the patient, family and they are in agreement.  CODE STATUS:     Code Status Orders        Start     Ordered   12/24/15 0222  Full code   Continuous     12/24/15 0221    Code Status History    Date Active Date Inactive Code Status Order ID Comments User Context   This patient has a current code status but no historical code status.    Advance Directive Documentation        Most Recent Value   Type of Advance Directive  Healthcare Power of  Attorney, Living will   Pre-existing out of facility DNR order (yellow form or pink MOST form)     "MOST" Form in Place?        TOTAL TIME TAKING CARE OF THIS PATIENT: 75 minutes.    Altamese Dilling M.D on 12/25/2015 at 6:32 PM  Between 7am to 6pm - Pager - [phone]  After 6pm go to [email or web address] - Social research officer, government  [facility] [place] [facility]  Office  [phone]  CC: Primary care physician; No primary care provider on file.   Note: This dictation was prepared with Dragon dictation along with smaller phrase technology. Any transcriptional errors that result from this process are unintentional.

## 2015-12-25 NOTE — Progress Notes (Signed)
Pt. Transported by [facility], necessary documents re: Pt. given. Family will follow.

## 2015-12-25 NOTE — Progress Notes (Signed)
Patient ID: Caitlin Colon, female   DOB: 07/22/1919, 82 y.o.   MRN: [id number]  Please call the floor manager at extension [phone] admitting physician assignment.   82 yo female with a PMH of hypertension, hypothyroidism, multiple sclerosis who was admitted to [facility] for acute pancreatitis with hyperbilirubinemia and transaminitis. Currently there is no GI service at the facility and she has been accepted for consult by our GI service at [facility].        Component Value Units   Bilirubin, direct [[id number]] (Abnormal) Collected: 12/25/15 0558   Updated: 12/25/15 0853    Specimen Type: Blood     Bilirubin, Direct 3.1 (H) mg/dL   Comprehensive metabolic panel [[id number]] (Abnormal) Collected: 12/25/15 0558   Updated: 12/25/15 0742    Specimen Type: Blood     Sodium 137 mmol/L    Potassium 3.5 mmol/L    Chloride 106 mmol/L    CO2 23 mmol/L    Glucose, Bld 92 mg/dL    BUN 19 mg/dL    Creatinine, Ser 0.86 mg/dL    Calcium 7.7 (L) mg/dL    Total Protein 5.5 (L) g/dL    Albumin 2.9 (L) g/dL    AST 578 (H) U/L    ALT 180 (H) U/L    Alkaline Phosphatase 93 U/L    Total Bilirubin 4.6 (H) mg/dL    GFR calc non Af Amer 57 (L) mL/min    GFR calc Af Amer >60 mL/min    Anion gap 8   Lipase, blood [[id number]] Collected: 12/25/15 0558   Updated: 12/25/15 0742    Specimen Type: Blood     Lipase 19 U/L   CBC [[id number]] (Abnormal) Collected: 12/25/15 0558   Updated: 12/25/15 0644    Specimen Type: Blood     WBC 7.7 K/uL    RBC 2.98 (L) MIL/uL    Hemoglobin 10.4 (L) g/dL    HCT 27.2 (L) %    MCV 102.4 (H) fL    MCH 35.1 (H) pg    MCHC 34.3 g/dL    RDW 53.6 %    Platelets 128 (L) K/uL   Lipase, blood [[id number]] (Abnormal) Collected: 12/24/15 0509   Updated: 12/24/15 1351    Specimen Type: Blood     Lipase 152 (H) U/L   Comprehensive metabolic panel [[id number]] (Abnormal) Collected: 12/24/15 0509    Updated: 12/24/15 0601    Specimen Type: Blood     Sodium 138 mmol/L    Potassium 3.8 mmol/L    Chloride 103 mmol/L    CO2 27 mmol/L    Glucose, Bld 111 (H) mg/dL    BUN 23 (H) mg/dL    Creatinine, Ser 4.33 mg/dL    Calcium 8.3 (L) mg/dL    Total Protein 6.1 (L) g/dL    Albumin 3.4 (L) g/dL    AST 295 (H) U/L    ALT 320 (H) U/L    Alkaline Phosphatase 106 U/L    Total Bilirubin 3.6 (H) mg/dL    GFR calc non Af Amer 53 (L) mL/min    GFR calc Af Amer >60 mL/min    Anion gap 8   CBC [[id number]] (Abnormal) Collected: 12/24/15 0509   Updated: 12/24/15 0538    Specimen Type: Blood     WBC 14.7 (H) K/uL    RBC 3.27 (L) MIL/uL    Hemoglobin 11.5 (L) g/dL    HCT 30.1 (L) %    MCV 101.5 (H) fL    MCH  35.1 (H) pg    MCHC 34.6 g/dL    RDW 42.5 %    Platelets 158 K/uL   Urinalysis complete, with microscopic [[id number]] (Abnormal) Collected: 12/24/15 0400   Updated: 12/24/15 0422    Specimen Type: Urine    Specimen Source: Urine, Random     Color, Urine AMBER (A)    APPearance CLEAR (A)    Glucose, UA NEGATIVE mg/dL    Bilirubin Urine NEGATIVE    Ketones, ur NEGATIVE mg/dL    Specific Gravity, Urine 1.012    Hgb urine dipstick 1+ (A)    pH 6.0    Protein, ur 30 (A) mg/dL    Nitrite NEGATIVE    Leukocytes, UA NEGATIVE    RBC / HPF 0-5 RBC/hpf    WBC, UA 0-5 WBC/hpf    Bacteria, UA RARE (A)    Squamous Epithelial / LPF 0-5 (A)   Lipase, blood [[id number]] (Abnormal) Collected: 12/23/15 1824   Updated: 12/23/15 2004    Specimen Type: Blood    Specimen Source: Vein     Lipase 1258 (H) U/L   Comprehensive metabolic panel [[id number]] (Abnormal) Collected: 12/23/15 1824   Updated: 12/23/15 2004    Specimen Type: Blood    Specimen Source: Vein     Sodium 136 mmol/L    Potassium 4.0 mmol/L    Chloride 99 (L) mmol/L    CO2 25 mmol/L    Glucose, Bld 111 (H) mg/dL    BUN 27 (H) mg/dL    Creatinine, Ser 0.10 mg/dL     Calcium 9.2 mg/dL    Total Protein 7.2 g/dL    Albumin 4.1 g/dL    AST 932 (H) U/L    ALT 261 (H) U/L    Alkaline Phosphatase 106 U/L    Total Bilirubin 2.6 (H) mg/dL    GFR calc non Af Amer 54 (L) mL/min    GFR calc Af Amer >60 mL/min    Anion gap 12   CBC [[id number]] (Abnormal) Collected: 12/23/15 1824   Updated: 12/23/15 1917    Specimen Type: Blood    Specimen Source: Vein     WBC 8.3 K/uL    RBC 3.84 MIL/uL    Hemoglobin 13.5 g/dL    HCT 54.2 %    MCV 706.2 (H) fL    MCH 35.1 (H) pg    MCHC 33.8 g/dL    RDW 37.6 %    Platelets 169 K/uL    MRI ABDOMEN WITHOUT AND WITH CONTRAST (INCLUDING MRCP)  IMPRESSION: Cholelithiasis with associated gallbladder dilatation and wall thickening, suspicious for acute cholecystitis. Consider nuclear medicine hepatobiliary scan for further evaluation if clinically warranted.  Diffuse biliary ductal dilatation with common bile duct measuring 10 mm. Choledocholithiasis, with at least 2 stones in distal common bile duct, largest measuring 10 mm.  Exam partially degraded by respiratory motion artifact. Tiny right pleural effusion.   Electronically Signed  By: Myles Rosenthal M.D.  On: 12/25/2015 16:15 -----------------------------------------------------------------------------------------------------------------------------  Sanda Klein, M.D.

## 2015-12-25 NOTE — H&P (Signed)
History and Physical    Colletta Soliz ZOX:[id number] DOB: 16-Oct-1919 DOA: 7/1/2017Dollye Glassero primary care provider on file.   Patient coming from: [facility].  Chief Complaint: Transferred for GI evaluation and ERCP.  HPI: Caitlin Colon is a 82 y.o. female with a medical history significant of multiple sclerosis, hypertension, hypothyroidism who was transferred from [facility] for ERCP due to no GI service at that facility.  Per patient and her relatives, on Thursday she developed sudden abdominal pain associated with chills and one episode of emesis. She denies fever, diarrhea, melena or hematochezia. She states that she occasionally has indigestion and has suffered from flatulence for decades. She denies abdominal pain at this time. Pain level is 0/10.  She subsequently went to the emergency department at [facility] on Thursday where she was admitted and diagnosed with acute gallstone pancreatitis. MRCP showed cholelithiasis with associated gallbladder dilatation and wall thickening suspicious for acute cholecystitis. There was diffuse biliary ductal dilatation with 2 stones in the distal CBD, the largest measuring 10 mm.  ED Course: Not applied.  Review of Systems: As per HPI otherwise 10 point review of systems negative.  Past Medical History  Diagnosis Date  . Multiple sclerosis [facility])     Past Surgical History  Procedure Laterality Date  . Vitrectomy       reports that she has never smoked. She does not have any smokeless tobacco history on file. She reports that she does not drink alcohol or use illicit drugs.  No Known Allergies  No family history on file.   Prior to Admission medications   Medication Sig Start Date End Date Taking? Authorizing Provider  atenolol (TENORMIN) 50 MG tablet Take 50 mg by mouth daily.    Historical Provider, MD  ciprofloxacin (CIPRO) 400 MG/200ML SOLN Inject 200 mLs (400 mg  total) into the vein daily. 12/25/15   Altamese Dilling, MD  levothyroxine (SYNTHROID, LEVOTHROID) 50 MCG tablet Take 50 mcg by mouth daily before breakfast.    Historical Provider, MD  metroNIDAZOLE (FLAGYL) 5-0.79 MG/ML-% IVPB Inject 100 mLs (500 mg total) into the vein every 8 (eight) hours. 12/25/15   Altamese Dilling, MD  ondansetron (ZOFRAN) 4 MG tablet Take 1 tablet (4 mg total) by mouth every 6 (six) hours as needed for nausea. 12/25/15   Altamese Dilling, MD    Physical Exam: Filed Vitals:   12/25/15 2112  BP: 180/82  Pulse: 70  Temp: 99.2 F (37.3 C)  TempSrc: Oral  Resp: 16  Height: 5' (1.524 m)  Weight: 58.1 kg (128 lb 1.4 oz)  SpO2: 94%      Constitutional: NAD, calm, comfortable Filed Vitals:   12/25/15 2112  BP: 180/82  Pulse: 70  Temp: 99.2 F (37.3 C)  TempSrc: Oral  Resp: 16  Height: 5' (1.524 m)  Weight: 58.1 kg (128 lb 1.4 oz)  SpO2: 94%   Eyes: PERRL, lids and conjunctivae normal ENMT: Mucous membranes are moist. Posterior pharynx clear of any exudate or lesions. Neck: normal, supple, no masses, no thyromegaly Respiratory: clear to auscultation bilaterally, no wheezing, no crackles. No accessory muscle use.  Cardiovascular: Regular rate and rhythm, no murmurs / rubs / gallops. No extremity edema. 2+ pedal pulses. No carotid bruits.  Abdomen: no tenderness, no masses palpated. No hepatosplenomegaly. Bowel sounds positive.  Musculoskeletal: no clubbing / cyanosis. Good ROM, no contractures. Normal muscle tone.  Skin: no significant rashes, lesions, ulcers limited skin exam Neurologic:  CN 2-12 grossly intact. Sensation intact, Mild global weakness. Psychiatric: Normal judgment and insight. Alert and oriented x 4. Normal mood.     Labs on Admission: I have personally reviewed following labs and imaging studies  CBC:  Recent Labs Lab 12/23/15 1824 12/24/15 0509 12/25/15 0558  WBC 8.3 14.7* 7.7  HGB 13.5 11.5* 10.4*  HCT 39.9 33.2*  30.5*  MCV 103.8* 101.5* 102.4*  PLT 169 158 128*   Basic Metabolic Panel:  Recent Labs Lab 12/23/15 1824 12/24/15 0509 12/25/15 0558  NA 136 138 137  K 4.0 3.8 3.5  CL 99* 103 106  CO2 25 27 23   GLUCOSE 111* 111* 92  BUN 27* 23* 19  CREATININE 0.89 0.91 0.85  CALCIUM 9.2 8.3* 7.7*   GFR: Estimated Creatinine Clearance: 33 mL/min (by C-G formula based on Cr of 0.85). Liver Function Tests:  Recent Labs Lab 12/23/15 1824 12/24/15 0509 12/25/15 0558  AST 461* 378* 146*  ALT 261* 320* 180*  ALKPHOS 106 106 93  BILITOT 2.6* 3.6* 4.6*  PROT 7.2 6.1* 5.5*  ALBUMIN 4.1 3.4* 2.9*    Recent Labs Lab 12/23/15 1824 12/24/15 0509 12/25/15 0558  LIPASE 1258* 152* 19   Urine analysis:    Component Value Date/Time   COLORURINE AMBER* 12/24/2015 0400   APPEARANCEUR CLEAR* 12/24/2015 0400   LABSPEC 1.012 12/24/2015 0400   PHURINE 6.0 12/24/2015 0400   GLUCOSEU NEGATIVE 12/24/2015 0400   HGBUR 1+* 12/24/2015 0400   BILIRUBINUR NEGATIVE 12/24/2015 0400   KETONESUR NEGATIVE 12/24/2015 0400   PROTEINUR 30* 12/24/2015 0400   NITRITE NEGATIVE 12/24/2015 0400   LEUKOCYTESUR NEGATIVE 12/24/2015 0400    Radiological Exams on Admission: Mr Abd W/wo Cm/mrcp  12/25/2015  CLINICAL DATA:  Nausea with vomiting and abdominal pain. Acute pancreatitis. Cholelithiasis. EXAM: MRI ABDOMEN WITHOUT AND WITH CONTRAST (INCLUDING MRCP) TECHNIQUE: Multiplanar multisequence MR imaging of the abdomen was performed both before and after the administration of intravenous contrast. Heavily T2-weighted images of the biliary and pancreatic ducts were obtained, and three-dimensional MRCP images were rendered by post processing. CONTRAST:  10mL MULTIHANCE GADOBENATE DIMEGLUMINE 529 MG/ML IV SOLN COMPARISON:  Ultrasound on 12/23/2015 FINDINGS: Exam partially degraded by respiratory motion artifact. Lower chest:  Tiny right pleural effusion.  Mild cardiomegaly. Hepatobiliary: Evaluation is limited by  respiratory motion artifact, however no liver masses are identified. Several gallstones are seen and there is mild gallbladder dilatation and wall thickening. Diffuse biliary ductal dilatation seen with common bile duct measuring 10 mm. At least 2 stones are seen in the distal common bile duct measuring 9-10 mm. Pancreas: Limited evaluation due to respiratory motion artifact. No pancreatic mass or pancreatic ductal dilatation demonstrated. No significant peripancreatic inflammatory changes or fluid collections identified. No mass, inflammatory changes, or other parenchymal abnormality identified. Spleen:  Within normal limits in size and appearance. Adrenals/Urinary Tract: No masses identified. No evidence of hydronephrosis. Tiny cyst noted in upper pole of right kidney. Stomach/Bowel: Visualized portions within the abdomen are unremarkable. Vascular/Lymphatic: No pathologically enlarged lymph nodes identified. No abdominal aortic aneurysm demonstrated. Other:  None. Musculoskeletal:  No suspicious bone lesions identified. IMPRESSION: Cholelithiasis with associated gallbladder dilatation and wall thickening, suspicious for acute cholecystitis. Consider nuclear medicine hepatobiliary scan for further evaluation if clinically warranted. Diffuse biliary ductal dilatation with common bile duct measuring 10 mm. Choledocholithiasis, with at least 2 stones in distal common bile duct, largest measuring 10 mm. Exam partially degraded by respiratory motion artifact. Tiny right pleural effusion. Electronically Signed  By: Myles Rosenthal M.D.   On: 12/25/2015 16:15   US Abdomen Limited Ruq  12/24/2015  CLINICAL DATA:  82 year old female with elevated LFTs and line pains with vomiting. EXAM: US ABDOMEN LIMITED - RIGHT UPPER QUADRANT COMPARISON:  None. FINDINGS: Gallbladder: There is sludge and stones within the gallbladder. There are two stones within the gallbladder measuring up to 2 cm. The gallbladder is distended. There is  diffuse thickening and edema of the gallbladder wall. An area of soft tissue thickening and edema at the gallbladder fundus may folded gallbladder lumen, however complex collection adjacent to the gallbladder and therefore possibly of perforation and pericholecystic collection is not excluded. CT with contrast may provide better evaluation if clinically indicated. Common bile duct: Diameter: 5 mm Liver: No focal lesion identified. Within normal limits in parenchymal echogenicity. IMPRESSION: Cholelithiasis with findings concerning for acute cholecystitis. A complex echogenic area adjacent the gallbladder fundus may represent folded portion of the gallbladder. A pericholecystic collection is not excluded. Clinical correlation is recommended. CT with IV contrast may provide additional information. Electronically Signed   By: Elgie Collard M.D.   On: 12/24/2015 00:05     Assessment/Plan Principal Problem:   Acute gallstone pancreatitis Admit to telemetry/inpatient. Keep nothing by mouth. Continue IV fluids. Analgesics as needed. Antiemetics as needed. Follow-up CBC, CMP and lipase levels in a.m. Gastroenterology to evaluate.  Active Problems:   Essential hypertension Metoprolol 5 mg IVP every 8 hours. Monitor blood pressure closely.    Hypothyroidism Resume levothyroxine once the patient is cleared for oral intake.    Multiple sclerosis Supportive care.    DVT prophylaxis: SCDs. Code Status: Full code. Family Communication: Her son and daughter-in-law were present in the room. Disposition Plan: Admit for GI evaluation and ERCP Consults called: Dr. Charlott Rakes (gastroenterology) Admission status: Inpatient/MedSurg   Bobette Mo MD [facility] Pager [phone].  If 7PM-7AM, please contact night-coverage [email or web address] Password [facility]  12/25/2015, 9:44 PM

## 2015-12-26 ENCOUNTER — Encounter (HOSPITAL_COMMUNITY): Payer: Self-pay

## 2015-12-26 DIAGNOSIS — E039 Hypothyroidism, unspecified: Secondary | ICD-10-CM

## 2015-12-26 DIAGNOSIS — R740 Nonspecific elevation of levels of transaminase and lactic acid dehydrogenase [LDH]: Secondary | ICD-10-CM

## 2015-12-26 DIAGNOSIS — G35 Multiple sclerosis: Secondary | ICD-10-CM

## 2015-12-26 DIAGNOSIS — K851 Biliary acute pancreatitis without necrosis or infection: Principal | ICD-10-CM

## 2015-12-26 DIAGNOSIS — I10 Essential (primary) hypertension: Secondary | ICD-10-CM

## 2015-12-26 LAB — CBC WITH DIFFERENTIAL/PLATELET
Basophils Absolute: 0 10*3/uL (ref 0.0–0.1)
Basophils Relative: 0 %
Eosinophils Absolute: 0.2 10*3/uL (ref 0.0–0.7)
Eosinophils Relative: 2 %
HEMATOCRIT: 33 % — AB (ref 36.0–46.0)
HEMOGLOBIN: 10.7 g/dL — AB (ref 12.0–15.0)
LYMPHS ABS: 0.6 10*3/uL — AB (ref 0.7–4.0)
LYMPHS PCT: 8 %
MCH: 33.3 pg (ref 26.0–34.0)
MCHC: 32.4 g/dL (ref 30.0–36.0)
MCV: 102.8 fL — ABNORMAL HIGH (ref 78.0–100.0)
MONOS PCT: 6 %
Monocytes Absolute: 0.4 10*3/uL (ref 0.1–1.0)
NEUTROS ABS: 5.9 10*3/uL (ref 1.7–7.7)
NEUTROS PCT: 84 %
Platelets: 159 10*3/uL (ref 150–400)
RBC: 3.21 MIL/uL — AB (ref 3.87–5.11)
RDW: 13.2 % (ref 11.5–15.5)
WBC: 7 10*3/uL (ref 4.0–10.5)

## 2015-12-26 LAB — TYPE AND SCREEN
ABO/RH(D): A POS
ANTIBODY SCREEN: NEGATIVE

## 2015-12-26 LAB — ABO/RH: ABO/RH(D): A POS

## 2015-12-26 LAB — PHOSPHORUS: PHOSPHORUS: 2.1 mg/dL — AB (ref 2.5–4.6)

## 2015-12-26 LAB — MAGNESIUM: MAGNESIUM: 1.7 mg/dL (ref 1.7–2.4)

## 2015-12-26 LAB — MRSA PCR SCREENING: MRSA by PCR: NEGATIVE

## 2015-12-26 LAB — COMPREHENSIVE METABOLIC PANEL
ALT: 141 U/L — ABNORMAL HIGH (ref 14–54)
ANION GAP: 8 (ref 5–15)
AST: 88 U/L — AB (ref 15–41)
Albumin: 2.6 g/dL — ABNORMAL LOW (ref 3.5–5.0)
Alkaline Phosphatase: 108 U/L (ref 38–126)
BILIRUBIN TOTAL: 4.2 mg/dL — AB (ref 0.3–1.2)
BUN: 10 mg/dL (ref 6–20)
CHLORIDE: 103 mmol/L (ref 101–111)
CO2: 25 mmol/L (ref 22–32)
Calcium: 8.2 mg/dL — ABNORMAL LOW (ref 8.9–10.3)
Creatinine, Ser: 0.79 mg/dL (ref 0.44–1.00)
GFR calc Af Amer: >60 mL/min (ref >60)
Glucose, Bld: 110 mg/dL — ABNORMAL HIGH (ref 65–99)
POTASSIUM: 3.5 mmol/L (ref 3.5–5.1)
Sodium: 136 mmol/L (ref 135–145)
TOTAL PROTEIN: 5.6 g/dL — AB (ref 6.5–8.1)

## 2015-12-26 LAB — PROTIME-INR
INR: 1.12 (ref 0.00–1.49)
PROTHROMBIN TIME: 14.6 s (ref 11.6–15.2)

## 2015-12-26 LAB — APTT: aPTT: 29 seconds (ref 24–37)

## 2015-12-26 LAB — LIPASE, BLOOD: LIPASE: 19 U/L (ref 11–51)

## 2015-12-26 MED ORDER — MAGNESIUM SULFATE 2 GM/50ML IV SOLN
2.0000 g | Freq: Once | INTRAVENOUS | Status: AC
  Administered 2015-12-26: 2 g via INTRAVENOUS
  Filled 2015-12-26: qty 50

## 2015-12-26 MED ORDER — LEVOTHYROXINE SODIUM 100 MCG IV SOLR
25.0000 ug | Freq: Every day | INTRAVENOUS | Status: DC
  Administered 2015-12-26: 25 ug via INTRAVENOUS
  Filled 2015-12-26: qty 5

## 2015-12-26 NOTE — Progress Notes (Signed)
PROGRESS NOTE    Caitlin Colon  UEA:[id number] DOB: 1919-08-08 DOA: 12/25/2015 PCP: No primary care provider on file.   Brief Narrative:  HPI on 12/25/2015 by Dr. Sanda Klein Caitlin Colon is a 82 y.o. female with a medical history significant of multiple sclerosis, hypertension, hypothyroidism who was transferred from [facility] for ERCP due to no GI service at that facility. Per patient and her relatives, on Thursday she developed sudden abdominal pain associated with chills and one episode of emesis. She denies fever, diarrhea, melena or hematochezia. She states that she occasionally has indigestion and has suffered from flatulence for decades. She denies abdominal pain at this time. Pain level is 0/10. She subsequently went to the emergency department at [facility] on Thursday where she was admitted and diagnosed with acute gallstone pancreatitis. MRCP showed cholelithiasis with associated gallbladder dilatation and wall thickening suspicious for acute cholecystitis. There was diffuse biliary ductal dilatation with 2 stones in the distal CBD, the largest measuring 10 mm. Assessment & Plan  Acute gallstone pancreatitis -MRCP at [facility] showed dialated CBD to 10mm with atleast 2 stones -patient transferred to [facility] as there was no GI coverage -General surgery assess patient at [facility] -Gastroenterology consulted and appreciated, ERCP pending -Lipase resolved, currently 19 -LFTs trending downward -Total bilirubin 4.2 -Continue IVF, clear liquids  Essential hypertension -Metoprolol 5 mg IVP every 8 hours as patient was NPO -Home atenolol held  Hypothyroidism -Will start patient on IV synthroid  Multiple sclerosis -Stable, continue supportive care.  DVT Prophylaxis  SCDs  Code Status: Full  Family Communication: Family at bedside  Disposition Plan: Admitted, pending ERCP  Consultants Gastroenterology  Procedures   None  Antibiotics   Anti-infectives    None      Subjective:   Caitlin Colon seen and examined today.  Patient currently denies abdominal pain, nausea, vomiting.  She inquires about eating a regular meal and feels hungry.  Denies chest pain, shortness of breath, dizziness, headache.    Objective:   Filed Vitals:   12/25/15 2112 12/26/15 0500  BP: 180/82 152/80  Pulse: 70 72  Temp: 99.2 F (37.3 C) 97.7 F (36.5 C)  TempSrc: Oral Oral  Resp: 16 18  Height: 5' (1.524 m)   Weight: 58.1 kg (128 lb 1.4 oz)   SpO2: 94% 93%    Intake/Output Summary (Last 24 hours) at 12/26/15 1316 Last data filed at 12/26/15 0701  Gross per 24 hour  Intake 636.75 ml  Output      0 ml  Net 636.75 ml   Filed Weights   12/25/15 2112  Weight: 58.1 kg (128 lb 1.4 oz)    Exam  General: Well developed, well nourished, NAD, appears stated age  HEENT: NCAT,  mucous membranes moist.   Cardiovascular: S1 S2 auscultated, RRR, no murmurs  Respiratory: Clear to auscultation bilaterally with equal chest rise  Abdomen: Soft, nontender, nondistended, + bowel sounds  Extremities: warm dry without cyanosis clubbing or edema  Neuro: AAOx3, nonfocal  Psych: Normal affect and demeanor    Data Reviewed: I have personally reviewed following labs and imaging studies  CBC:  Recent Labs Lab 12/23/15 1824 12/24/15 0509 12/25/15 0558 12/26/15 0352  WBC 8.3 14.7* 7.7 7.0  NEUTROABS  --   --   --  5.9  HGB 13.5 11.5* 10.4* 10.7*  HCT 39.9 33.2* 30.5* 33.0*  MCV 103.8* 101.5* 102.4* 102.8*  PLT 169 158 128* 159   Basic Metabolic Panel:  Recent Labs Lab 12/23/15 1824 12/24/15 0509 12/25/15 0558 12/26/15 0352  NA 136 138 137 136  K 4.0 3.8 3.5 3.5  CL 99* 103 106 103  CO2 25 27 23 25   GLUCOSE 111* 111* 92 110*  BUN 27* 23* 19 10  CREATININE 0.89 0.91 0.85 0.79  CALCIUM 9.2 8.3* 7.7* 8.2*  MG  --   --   --  1.7  PHOS  --   --   --  2.1*   GFR: Estimated Creatinine Clearance: 35  mL/min (by C-G formula based on Cr of 0.79). Liver Function Tests:  Recent Labs Lab 12/23/15 1824 12/24/15 0509 12/25/15 0558 12/26/15 0352  AST 461* 378* 146* 88*  ALT 261* 320* 180* 141*  ALKPHOS 106 106 93 108  BILITOT 2.6* 3.6* 4.6* 4.2*  PROT 7.2 6.1* 5.5* 5.6*  ALBUMIN 4.1 3.4* 2.9* 2.6*    Recent Labs Lab 12/23/15 1824 12/24/15 0509 12/25/15 0558 12/26/15 0352  LIPASE 1258* 152* 19 19   No results for input(s): AMMONIA in the last 168 hours. Coagulation Profile:  Recent Labs Lab 12/26/15 0352  INR 1.12   Cardiac Enzymes: No results for input(s): CKTOTAL, CKMB, CKMBINDEX, TROPONINI in the last 168 hours. BNP (last 3 results) No results for input(s): PROBNP in the last 8760 hours. HbA1C: No results for input(s): HGBA1C in the last 72 hours. CBG: No results for input(s): GLUCAP in the last 168 hours. Lipid Profile: No results for input(s): CHOL, HDL, LDLCALC, TRIG, CHOLHDL, LDLDIRECT in the last 72 hours. Thyroid Function Tests: No results for input(s): TSH, T4TOTAL, FREET4, T3FREE, THYROIDAB in the last 72 hours. Anemia Panel: No results for input(s): VITAMINB12, FOLATE, FERRITIN, TIBC, IRON, RETICCTPCT in the last 72 hours. Urine analysis:    Component Value Date/Time   COLORURINE AMBER* 12/24/2015 0400   APPEARANCEUR CLEAR* 12/24/2015 0400   LABSPEC 1.012 12/24/2015 0400   PHURINE 6.0 12/24/2015 0400   GLUCOSEU NEGATIVE 12/24/2015 0400   HGBUR 1+* 12/24/2015 0400   BILIRUBINUR NEGATIVE 12/24/2015 0400   KETONESUR NEGATIVE 12/24/2015 0400   PROTEINUR 30* 12/24/2015 0400   NITRITE NEGATIVE 12/24/2015 0400   LEUKOCYTESUR NEGATIVE 12/24/2015 0400   Sepsis Labs: @LABRCNTIP (procalcitonin:4,lacticidven:4)  ) Recent Results (from the past 240 hour(s))  MRSA PCR Screening     Status: None   Collection Time: 12/25/15 11:44 PM  Result Value Ref Range Status   MRSA by PCR NEGATIVE NEGATIVE Final    Comment:        The GeneXpert MRSA Assay  (FDA approved for NASAL specimens only), is one component of a comprehensive MRSA colonization surveillance program. It is not intended to diagnose MRSA infection nor to guide or monitor treatment for MRSA infections.       Radiology Studies: Mr Roe Coombs W/wo Cm/mrcp  12/25/2015  CLINICAL DATA:  Nausea with vomiting and abdominal pain. Acute pancreatitis. Cholelithiasis. EXAM: MRI ABDOMEN WITHOUT AND WITH CONTRAST (INCLUDING MRCP) TECHNIQUE: Multiplanar multisequence MR imaging of the abdomen was performed both before and after the administration of intravenous contrast. Heavily T2-weighted images of the biliary and pancreatic ducts were obtained, and three-dimensional MRCP images were rendered by post processing. CONTRAST:  24mL MULTIHANCE GADOBENATE DIMEGLUMINE 529 MG/ML IV SOLN COMPARISON:  Ultrasound on 12/23/2015 FINDINGS: Exam partially degraded by respiratory motion artifact. Lower chest:  Tiny right pleural effusion.  Mild cardiomegaly. Hepatobiliary: Evaluation is limited by respiratory motion artifact, however no liver masses are identified. Several gallstones are seen and there is mild gallbladder dilatation and wall  thickening. Diffuse biliary ductal dilatation seen with common bile duct measuring 10 mm. At least 2 stones are seen in the distal common bile duct measuring 9-10 mm. Pancreas: Limited evaluation due to respiratory motion artifact. No pancreatic mass or pancreatic ductal dilatation demonstrated. No significant peripancreatic inflammatory changes or fluid collections identified. No mass, inflammatory changes, or other parenchymal abnormality identified. Spleen:  Within normal limits in size and appearance. Adrenals/Urinary Tract: No masses identified. No evidence of hydronephrosis. Tiny cyst noted in upper pole of right kidney. Stomach/Bowel: Visualized portions within the abdomen are unremarkable. Vascular/Lymphatic: No pathologically enlarged lymph nodes identified. No abdominal  aortic aneurysm demonstrated. Other:  None. Musculoskeletal:  No suspicious bone lesions identified. IMPRESSION: Cholelithiasis with associated gallbladder dilatation and wall thickening, suspicious for acute cholecystitis. Consider nuclear medicine hepatobiliary scan for further evaluation if clinically warranted. Diffuse biliary ductal dilatation with common bile duct measuring 10 mm. Choledocholithiasis, with at least 2 stones in distal common bile duct, largest measuring 10 mm. Exam partially degraded by respiratory motion artifact. Tiny right pleural effusion. Electronically Signed   By: Myles Rosenthal M.D.   On: 12/25/2015 16:15     Scheduled Meds: . famotidine (PEPCID) IV  20 mg Intravenous QHS  . metoprolol  5 mg Intravenous Q8H  . sodium chloride flush  3 mL Intravenous Q12H   Continuous Infusions: . dextrose 5 % and 0.9 % NaCl with KCl 20 mEq/L 75 mL/hr at 12/25/15 2314     LOS: 1 day   Time Spent in minutes   30 minutes  Caitlin Colon D.O. on 12/26/2015 at 1:16 PM  Between 7am to 7pm - Pager - [phone]  After 7pm go to [email or web address] - password [email or web address]  And look for the night coverage person covering for me after hours  [facility] Office  [phone]

## 2015-12-26 NOTE — Consult Note (Signed)
Referring Provider: Dr. Catha Gosselin Primary Care Physician:  No primary care provider on file. Primary Gastroenterologist:  UNASSIGNED  Reason for Consultation:  Choledocholithiasis; Gallstone pancreatitis  HPI: Caitlin Colon is a 82 y.o. female seen for a consult after being transferred from [facility] due to lack of GI coverage for common bile duct stones. Had one episode of nausea and vomiting last week without abdominal pain, fevers, or chills and was sent to the hospital on 12/23/15 where she was found to have gallstone pancreatitis with TB 2.6, ALP 106, AST 461, ALT 261, Lipase 1258. An U/S showed acute cholecystitis with a normal sized CBD. MRCP showed 2 distal CBD stones (measuring 9-10 mm). MRCP yesterday showed dilated CBD to 10 mm with at least 2 stones in the distal CBD. GB findings also concerning for acute cholecystitis with gallbladder dilation and wall thickening. LFTs yesterday with normal lipase, TB 4.6 (DB 3.1), ALP 93, AST 146, ALT 180 and today TB 4.2, ALP 108, AST 88, ALT 141. Patient transferred here yesterday from [facility] due to lack of GI coverage for a preop ERCP. Daughter at bedside.   Past Medical History  Diagnosis Date  . Multiple sclerosis [facility])     Past Surgical History  Procedure Laterality Date  . Vitrectomy      Prior to Admission medications   Medication Sig Start Date End Date Taking? Authorizing Provider  atenolol (TENORMIN) 50 MG tablet Take 50 mg by mouth daily.    Historical Provider, MD  ciprofloxacin (CIPRO) 400 MG/200ML SOLN Inject 200 mLs (400 mg total) into the vein daily. 12/25/15   Altamese Dilling, MD  levothyroxine (SYNTHROID, LEVOTHROID) 50 MCG tablet Take 50 mcg by mouth daily before breakfast.    Historical Provider, MD  metroNIDAZOLE (FLAGYL) 5-0.79 MG/ML-% IVPB Inject 100 mLs (500 mg total) into the vein every 8 (eight) hours. 12/25/15   Altamese Dilling, MD  ondansetron (ZOFRAN) 4 MG tablet Take 1 tablet (4 mg total) by mouth every 6 (six)  hours as needed for nausea. 12/25/15   Altamese Dilling, MD    Scheduled Meds: . famotidine (PEPCID) IV  20 mg Intravenous QHS  . magnesium sulfate 1 - 4 g bolus IVPB  2 g Intravenous Once  . metoprolol  5 mg Intravenous Q8H  . sodium chloride flush  3 mL Intravenous Q12H   Continuous Infusions: . dextrose 5 % and 0.9 % NaCl with KCl 20 mEq/L 75 mL/hr at 12/25/15 2314   PRN Meds:.morphine injection, ondansetron **OR** ondansetron (ZOFRAN) IV  Allergies as of 12/25/2015  . (No Known Allergies)    History reviewed. No pertinent family history.  Social History   Social History  . Marital Status: Married    Spouse Name: N/A  . Number of Children: N/A  . Years of Education: N/A   Occupational History  . Not on file.   Social History Main Topics  . Smoking status: Never Smoker   . Smokeless tobacco: Not on file  . Alcohol Use: No  . Drug Use: No  . Sexual Activity: Not on file   Other Topics Concern  . Not on file   Social History Narrative    Review of Systems: All negative except as stated above in HPI.  Physical Exam: Vital signs: Filed Vitals:   12/25/15 2112 12/26/15 0500  BP: 180/82 152/80  Pulse: 70 72  Temp: 99.2 F (37.3 C) 97.7 F (36.5 C)  Resp: 16 18   Last BM Date: 12/26/15 General:   Alert, elderly,  frail, hard of hearing, no acute distress HEENT: +scleral icterus, oropharynx clear Neck: supple, nontender Lungs:  Clear throughout to auscultation.   No wheezes, crackles, or rhonchi. No acute distress. Heart:  Regular rate and rhythm; no murmurs, clicks, rubs,  or gallops. Abdomen: epigastric and RUQ tenderness with guarding, soft, nondistended, +BS  Rectal:  Deferred Ext: no edema  GI:  Lab Results:  Recent Labs  12/24/15 0509 12/25/15 0558 12/26/15 0352  WBC 14.7* 7.7 7.0  HGB 11.5* 10.4* 10.7*  HCT 33.2* 30.5* 33.0*  PLT 158 128* 159   BMET  Recent Labs  12/24/15 0509 12/25/15 0558 12/26/15 0352  NA 138 137 136  K 3.8  3.5 3.5  CL 103 106 103  CO2 27 23 25   GLUCOSE 111* 92 110*  BUN 23* 19 10  CREATININE 0.91 0.85 0.79  CALCIUM 8.3* 7.7* 8.2*   LFT  Recent Labs  12/25/15 0558 12/26/15 0352  PROT 5.5* 5.6*  ALBUMIN 2.9* 2.6*  AST 146* 88*  ALT 180* 141*  ALKPHOS 93 108  BILITOT 4.6* 4.2*  BILIDIR 3.1*  --    PT/INR  Recent Labs  12/26/15 0352  LABPROT 14.6  INR 1.12     Studies/Results: Mr Abd W/wo Cm/mrcp  12/25/2015  CLINICAL DATA:  Nausea with vomiting and abdominal pain. Acute pancreatitis. Cholelithiasis. EXAM: MRI ABDOMEN WITHOUT AND WITH CONTRAST (INCLUDING MRCP) TECHNIQUE: Multiplanar multisequence MR imaging of the abdomen was performed both before and after the administration of intravenous contrast. Heavily T2-weighted images of the biliary and pancreatic ducts were obtained, and three-dimensional MRCP images were rendered by post processing. CONTRAST:  40mL MULTIHANCE GADOBENATE DIMEGLUMINE 529 MG/ML IV SOLN COMPARISON:  Ultrasound on 12/23/2015 FINDINGS: Exam partially degraded by respiratory motion artifact. Lower chest:  Tiny right pleural effusion.  Mild cardiomegaly. Hepatobiliary: Evaluation is limited by respiratory motion artifact, however no liver masses are identified. Several gallstones are seen and there is mild gallbladder dilatation and wall thickening. Diffuse biliary ductal dilatation seen with common bile duct measuring 10 mm. At least 2 stones are seen in the distal common bile duct measuring 9-10 mm. Pancreas: Limited evaluation due to respiratory motion artifact. No pancreatic mass or pancreatic ductal dilatation demonstrated. No significant peripancreatic inflammatory changes or fluid collections identified. No mass, inflammatory changes, or other parenchymal abnormality identified. Spleen:  Within normal limits in size and appearance. Adrenals/Urinary Tract: No masses identified. No evidence of hydronephrosis. Tiny cyst noted in upper pole of right kidney.  Stomach/Bowel: Visualized portions within the abdomen are unremarkable. Vascular/Lymphatic: No pathologically enlarged lymph nodes identified. No abdominal aortic aneurysm demonstrated. Other:  None. Musculoskeletal:  No suspicious bone lesions identified. IMPRESSION: Cholelithiasis with associated gallbladder dilatation and wall thickening, suspicious for acute cholecystitis. Consider nuclear medicine hepatobiliary scan for further evaluation if clinically warranted. Diffuse biliary ductal dilatation with common bile duct measuring 10 mm. Choledocholithiasis, with at least 2 stones in distal common bile duct, largest measuring 10 mm. Exam partially degraded by respiratory motion artifact. Tiny right pleural effusion. Electronically Signed   By: Myles Rosenthal M.D.   On: 12/25/2015 16:15    Impression/Plan: 82 yo with gallstone pancreatitis and MRCP showed 2 stones in the distal CBD measuring 9-10 mm. Findings also concerning for acute cholecystitis. Clinically stable and no signs of cholangitis. ERCP indicated prior to possible GB surgery. Soft diet today and NPO p MN. Will discuss timing of ERCP with [facility] to see whether ERCP can be done tomorrow. Continue supportive care.  LOS: 1 day   Raeqwon Lux C.  12/26/2015, 10:58 AM  Pager [phone]  If no answer or after 5 PM call [phone]

## 2015-12-26 NOTE — Discharge Summary (Signed)
Physician Discharge Summary  Caitlin Colon UEA:[id number] DOB: [birth year] DOA: 12/25/2015  PCP: No primary care provider on file.  Admit date: 12/25/2015 Discharge date: 12/26/2015  Time spent: 45 minutes  Recommendations for Outpatient Follow-up:  Patient will be transferred to [facility] in [place], [place], under the care of Dr. Levin Bacon, general surgery.    Discharge Diagnoses:  Principal Problem:   Acute gallstone pancreatitis Active Problems:   Cholelithiasis   Transaminitis   Multiple sclerosis (HCC)   Essential hypertension   Hypothyroidism   Discharge Condition: Stable  Diet recommendation: NPO for possible procedure  Filed Weights   12/25/15 2112  Weight: 58.1 kg (128 lb 1.4 oz)    History of present illness:  on 12/25/2015 by Dr. Sanda Klein Chasitty Colon is a 82 y.o. female with a medical history significant of multiple sclerosis, hypertension, hypothyroidism who was transferred from [facility] for ERCP due to no GI service at that facility. Per patient and her relatives, on Thursday she developed sudden abdominal pain associated with chills and one episode of emesis. She denies fever, diarrhea, melena or hematochezia. She states that she occasionally has indigestion and has suffered from flatulence for decades. She denies abdominal pain at this time. Pain level is 0/10. She subsequently went to the emergency department at [facility] on Thursday where she was admitted and diagnosed with acute gallstone pancreatitis. MRCP showed cholelithiasis with associated gallbladder dilatation and wall thickening suspicious for acute cholecystitis. There was diffuse biliary ductal dilatation with 2 stones in the distal CBD, the largest measuring 10 mm.  Hospital Course:  Acute gallstone pancreatitis -MRCP at [facility] showed dialated CBD to 10mm with atleast 2 stones -patient transferred to [facility] as there was no GI coverage -General  surgery assess patient at [facility] -Gastroenterology consulted and appreciated, ERCP pending and cannot be performed until later this week -Lipase resolved, currently 19 -LFTs trending downward -Total bilirubin 4.2 -Continue IVF, clear liquids -Patient's daughter has requested transfer to [facility]. Spoke with general surgeon, Dr. Cathren Harsh, who has accepted the patient to his service.  Transaminitis -Secondary to the above -LFTs improving -Monitor CMP  Essential hypertension -Metoprolol 5 mg IVP every 8 hours as patient was NPO -Home atenolol held  Hypothyroidism -Will start patient on IV synthroid  Multiple sclerosis -Stable, continue supportive care.  Consultants Gastroenterology  Procedures  None  Discharge Exam: Filed Vitals:   12/25/15 2112 12/26/15 0500  BP: 180/82 152/80  Pulse: 70 72  Temp: 99.2 F (37.3 C) 97.7 F (36.5 C)  Resp: 16 18   Exam  General: Well developed, well nourished, NAD  HEENT: NCAT, mucous membranes moist.   Cardiovascular: S1 S2 auscultated, RRR, no murmurs  Respiratory: Clear to auscultation bilaterally with equal chest rise  Abdomen: Soft, nontender, nondistended, + bowel sounds  Extremities: warm dry without cyanosis clubbing or edema  Neuro: AAOx3, nonfocal  Psych: Normal affect and demeanor, pleasant  Discharge Instructions     Medication List    ASK your doctor about these medications        atenolol 50 MG tablet  Commonly known as:  TENORMIN  Take 50 mg by mouth daily.     ciprofloxacin 400 MG/200ML Soln  Commonly known as:  CIPRO  Inject 200 mLs (400 mg total) into the vein daily.     levothyroxine 50 MCG tablet  Commonly known as:  SYNTHROID, LEVOTHROID  Take 50 mcg by mouth daily before breakfast.  metroNIDAZOLE 5-0.79 MG/ML-% IVPB  Commonly known as:  FLAGYL  Inject 100 mLs (500 mg total) into the vein every 8 (eight) hours.     ondansetron 4 MG tablet  Commonly known as:  ZOFRAN  Take 1  tablet (4 mg total) by mouth every 6 (six) hours as needed for nausea.       No Known Allergies    The results of significant diagnostics from this hospitalization (including imaging, microbiology, ancillary and laboratory) are listed below for reference.    Significant Diagnostic Studies: Mr Roe Coombs W/wo Cm/mrcp  12/25/2015  CLINICAL DATA:  Nausea with vomiting and abdominal pain. Acute pancreatitis. Cholelithiasis. EXAM: MRI ABDOMEN WITHOUT AND WITH CONTRAST (INCLUDING MRCP) TECHNIQUE: Multiplanar multisequence MR imaging of the abdomen was performed both before and after the administration of intravenous contrast. Heavily T2-weighted images of the biliary and pancreatic ducts were obtained, and three-dimensional MRCP images were rendered by post processing. CONTRAST:  10mL MULTIHANCE GADOBENATE DIMEGLUMINE 529 MG/ML IV SOLN COMPARISON:  Ultrasound on 12/23/2015 FINDINGS: Exam partially degraded by respiratory motion artifact. Lower chest:  Tiny right pleural effusion.  Mild cardiomegaly. Hepatobiliary: Evaluation is limited by respiratory motion artifact, however no liver masses are identified. Several gallstones are seen and there is mild gallbladder dilatation and wall thickening. Diffuse biliary ductal dilatation seen with common bile duct measuring 10 mm. At least 2 stones are seen in the distal common bile duct measuring 9-10 mm. Pancreas: Limited evaluation due to respiratory motion artifact. No pancreatic mass or pancreatic ductal dilatation demonstrated. No significant peripancreatic inflammatory changes or fluid collections identified. No mass, inflammatory changes, or other parenchymal abnormality identified. Spleen:  Within normal limits in size and appearance. Adrenals/Urinary Tract: No masses identified. No evidence of hydronephrosis. Tiny cyst noted in upper pole of right kidney. Stomach/Bowel: Visualized portions within the abdomen are unremarkable. Vascular/Lymphatic: No pathologically  enlarged lymph nodes identified. No abdominal aortic aneurysm demonstrated. Other:  None. Musculoskeletal:  No suspicious bone lesions identified. IMPRESSION: Cholelithiasis with associated gallbladder dilatation and wall thickening, suspicious for acute cholecystitis. Consider nuclear medicine hepatobiliary scan for further evaluation if clinically warranted. Diffuse biliary ductal dilatation with common bile duct measuring 10 mm. Choledocholithiasis, with at least 2 stones in distal common bile duct, largest measuring 10 mm. Exam partially degraded by respiratory motion artifact. Tiny right pleural effusion. Electronically Signed   By: Myles Rosenthal M.D.   On: 12/25/2015 16:15   US Abdomen Limited Ruq  12/24/2015  CLINICAL DATA:  82 year old female with elevated LFTs and line pains with vomiting. EXAM: US ABDOMEN LIMITED - RIGHT UPPER QUADRANT COMPARISON:  None. FINDINGS: Gallbladder: There is sludge and stones within the gallbladder. There are two stones within the gallbladder measuring up to 2 cm. The gallbladder is distended. There is diffuse thickening and edema of the gallbladder wall. An area of soft tissue thickening and edema at the gallbladder fundus may folded gallbladder lumen, however complex collection adjacent to the gallbladder and therefore possibly of perforation and pericholecystic collection is not excluded. CT with contrast may provide better evaluation if clinically indicated. Common bile duct: Diameter: 5 mm Liver: No focal lesion identified. Within normal limits in parenchymal echogenicity. IMPRESSION: Cholelithiasis with findings concerning for acute cholecystitis. A complex echogenic area adjacent the gallbladder fundus may represent folded portion of the gallbladder. A pericholecystic collection is not excluded. Clinical correlation is recommended. CT with IV contrast may provide additional information. Electronically Signed   By: Elgie Collard M.D.   On: 12/24/2015 00:05  Microbiology: Recent Results (from the past 240 hour(s))  MRSA PCR Screening     Status: None   Collection Time: 12/25/15 11:44 PM  Result Value Ref Range Status   MRSA by PCR NEGATIVE NEGATIVE Final    Comment:        The GeneXpert MRSA Assay (FDA approved for NASAL specimens only), is one component of a comprehensive MRSA colonization surveillance program. It is not intended to diagnose MRSA infection nor to guide or monitor treatment for MRSA infections.      Labs: Basic Metabolic Panel:  Recent Labs Lab 12/23/15 1824 12/24/15 0509 12/25/15 0558 12/26/15 0352  NA 136 138 137 136  K 4.0 3.8 3.5 3.5  CL 99* 103 106 103  CO2 25 27 23 25   GLUCOSE 111* 111* 92 110*  BUN 27* 23* 19 10  CREATININE 0.89 0.91 0.85 0.79  CALCIUM 9.2 8.3* 7.7* 8.2*  MG  --   --   --  1.7  PHOS  --   --   --  2.1*   Liver Function Tests:  Recent Labs Lab 12/23/15 1824 12/24/15 0509 12/25/15 0558 12/26/15 0352  AST 461* 378* 146* 88*  ALT 261* 320* 180* 141*  ALKPHOS 106 106 93 108  BILITOT 2.6* 3.6* 4.6* 4.2*  PROT 7.2 6.1* 5.5* 5.6*  ALBUMIN 4.1 3.4* 2.9* 2.6*    Recent Labs Lab 12/23/15 1824 12/24/15 0509 12/25/15 0558 12/26/15 0352  LIPASE 1258* 152* 19 19   No results for input(s): AMMONIA in the last 168 hours. CBC:  Recent Labs Lab 12/23/15 1824 12/24/15 0509 12/25/15 0558 12/26/15 0352  WBC 8.3 14.7* 7.7 7.0  NEUTROABS  --   --   --  5.9  HGB 13.5 11.5* 10.4* 10.7*  HCT 39.9 33.2* 30.5* 33.0*  MCV 103.8* 101.5* 102.4* 102.8*  PLT 169 158 128* 159   Cardiac Enzymes: No results for input(s): CKTOTAL, CKMB, CKMBINDEX, TROPONINI in the last 168 hours. BNP: BNP (last 3 results) No results for input(s): BNP in the last 8760 hours.  ProBNP (last 3 results) No results for input(s): PROBNP in the last 8760 hours.  CBG: No results for input(s): GLUCAP in the last 168 hours.     SignedEdsel Petrin  [facility] 12/26/2015, 2:16  PM

## 2015-12-26 NOTE — Progress Notes (Signed)
Pt. Transporting to [facility] for ERCP procedure via [facility]. Sending physician is Dr. Catha Gosselin. Receiving physician is Dr. Cathren Harsh. Pt. Vital signs are stable and transfer process explained to patient and family.

## 2015-12-26 NOTE — Progress Notes (Signed)
Patient ID: Caitlin Colon, female   DOB: 1920-05-07, 82 y.o.   MRN: [id number]  Informed [name] daughter that ERCP has been scheduled for tomorrow (12/27/15) morning at 11AM but she wants to proceed with transfer to [facility]. Will cancel ERCP.

## 2015-12-26 NOTE — Progress Notes (Signed)
NURSING PROGRESS NOTE  Caitlin Colon: [id number] Admission Data: 12/25/2015 at 9PM Attending Provider: Bobette Mo, MD PCP: No primary care provider on file. Code status: Full  Allergies: No Known Allergies  Past Medical History:  Past Medical History  Diagnosis Date  . Multiple sclerosis [facility])     Past Surgical History:  Past Surgical History  Procedure Laterality Date  . Vitrectomy      Caitlin Colon is a 82 y.o. female patient, arrived to floor in room 5W01 via stretcher, transferred from [facility] via [facility]. Patient alert and oriented X 4. No acute distress noted. Denies pain.   Vital signs: Oral temperature 99.2 F (37.3 C), Blood pressure 180/82, Pulse 70, RR 16, SpO2 94 % on room air. Height 5 feet,  weight 128 lbs (58.1 kg).   Cardiac monitoring: Telemetry box 5W # 07 in place.  IV access: Left AC-fluids infusing; condition patent and no redness; dressing is clean dry and intact  Skin: intact, no pressure ulcer noted in sacral area.   Patient's ID armband verified with patient/ family, and in place. Information packet given to patient/ family. Fall risk assessed, SR up X2, patient/ family able to verbalize understanding of risks associated with falls and to call nurse or staff to assist before getting out of bed. Patient/ family oriented to room and equipment. Call bell within reach.

## 2015-12-27 SURGERY — ERCP, WITH INTERVENTION IF INDICATED
Anesthesia: General

## 2015-12-31 ENCOUNTER — Emergency Department
Admission: EM | Admit: 2015-12-31 | Discharge: 2015-12-31 | Disposition: A | Discharge status: Home / Self Care | Payer: Medicare Other | Attending: Emergency Medicine | Admitting: Emergency Medicine

## 2015-12-31 ENCOUNTER — Emergency Department: Payer: Medicare Other

## 2015-12-31 DIAGNOSIS — E039 Hypothyroidism, unspecified: Secondary | ICD-10-CM | POA: Diagnosis not present

## 2015-12-31 DIAGNOSIS — M25562 Pain in left knee: Secondary | ICD-10-CM | POA: Insufficient documentation

## 2015-12-31 DIAGNOSIS — Z7982 Long term (current) use of aspirin: Secondary | ICD-10-CM | POA: Insufficient documentation

## 2015-12-31 DIAGNOSIS — W19XXXA Unspecified fall, initial encounter: Secondary | ICD-10-CM

## 2015-12-31 DIAGNOSIS — W1839XA Other fall on same level, initial encounter: Secondary | ICD-10-CM | POA: Diagnosis not present

## 2015-12-31 MED ORDER — ACETAMINOPHEN 500 MG PO TABS
1000.0000 mg | ORAL_TABLET | Freq: Once | ORAL | Status: AC
  Administered 2015-12-31: 1000 mg via ORAL
  Filled 2015-12-31: qty 2

## 2015-12-31 MED ORDER — OXYCODONE HCL 5 MG PO TABS
5.0000 mg | ORAL_TABLET | Freq: Once | ORAL | Status: AC
  Administered 2015-12-31: 5 mg via ORAL
  Filled 2015-12-31: qty 1

## 2015-12-31 MED ORDER — OXYCODONE HCL 5 MG PO TABS: 5.0000 mg | ORAL_TABLET | Freq: Four times a day (QID) | ORAL | Status: DC | PRN

## 2015-12-31 MED ORDER — SENNOSIDES-DOCUSATE SODIUM 8.6-50 MG PO TABS: 1.0000 | ORAL_TABLET | Freq: Every day | ORAL | Status: DC

## 2015-12-31 NOTE — ED Provider Notes (Signed)
[facility] Emergency Department Provider Note  ____________________________________________  Time seen: Approximately 5:14 PM  I have reviewed the triage vital signs and the nursing notes.   HISTORY  Chief Complaint Fall   HPI Caitlin Colon is a 82 y.o. female with a history of MS, hypertension, hypothyroidism who presents for evaluation of left knee pain status post mechanical fall. Patient reports she was trying to get up from the toilet in the bathroom when she lost her balance and fell onto her left knee. She has been able to walk with a walker but the pain has gotten progressively worse. She hasn't tried anything for the pain today. She reports that the pain is only present when she weight bears, severe, sharp, nonradiating, located in her left knee. She reports no pain while laying in bed during my examination. She denies any other injuries including head injury and neck injury, back injury, hip pain, or pain in any other extremity.  Past Medical History  Diagnosis Date  . Multiple sclerosis [facility])     Patient Active Problem List   Diagnosis Date Noted  . Acute gallstone pancreatitis 12/25/2015  . Essential hypertension 12/25/2015  . Hypothyroidism 12/25/2015  . Pancreatitis 12/24/2015  . Cholelithiasis 12/24/2015  . Transaminitis 12/24/2015  . Multiple sclerosis (HCC) 12/24/2015    Past Surgical History  Procedure Laterality Date  . Vitrectomy      Current Outpatient Rx  Name  Route  Sig  Dispense  Refill  . aspirin 325 MG EC tablet   Oral   Take 325 mg by mouth at bedtime as needed for pain.         Marland Kitchen atenolol (TENORMIN) 50 MG tablet   Oral   Take 50 mg by mouth daily.         Marland Kitchen levothyroxine (SYNTHROID, LEVOTHROID) 50 MCG tablet   Oral   Take 50 mcg by mouth daily before breakfast.         . ondansetron (ZOFRAN) 4 MG tablet   Oral   Take 1 tablet (4 mg total) by mouth every 6 (six) hours as needed for nausea.   20 tablet   0   . oxyCODONE (ROXICODONE) 5 MG immediate release tablet   Oral   Take 1 tablet (5 mg total) by mouth every 6 (six) hours as needed for moderate pain or severe pain.   20 tablet   0   . senna-docusate (SENOKOT-S) 8.6-50 MG tablet   Oral   Take 1 tablet by mouth daily.   30 tablet   1     Allergies Review of patient's allergies indicates no known allergies.  History reviewed. No pertinent family history.  Social History Social History  Substance Use Topics  . Smoking status: Never Smoker   . Smokeless tobacco: None  . Alcohol Use: No    Review of Systems  Constitutional: Negative for fever. Eyes: Negative for visual changes. ENT: Negative for sore throat. Cardiovascular: Negative for chest pain. Respiratory: Negative for shortness of breath. Gastrointestinal: Negative for abdominal pain, vomiting or diarrhea. Genitourinary: Negative for dysuria. Musculoskeletal: Negative for back pain. +l eft knee pain Skin: Negative for rash. Neurological: Negative for headaches, weakness or numbness.  ____________________________________________   PHYSICAL EXAM:  VITAL SIGNS: ED Triage Vitals  Enc Vitals Group     BP 12/31/15 1645 133/63 mmHg     Pulse Rate 12/31/15 1645 55     Resp 12/31/15 1645 20     Temp 12/31/15 1645  98.2 F (36.8 C)     Temp Source 12/31/15 1645 Oral     SpO2 12/31/15 1641 94 %     Weight 12/31/15 1645 112 lb (50.803 kg)     Height 12/31/15 1645 5' (1.524 m)     Head Cir --      Peak Flow --      Pain Score --      Pain Loc --      Pain Edu? --      Excl. in GC? --     Constitutional: Alert and oriented. Well appearing and in no apparent distress. HEENT:      Head: Normocephalic and atraumatic.         Eyes: Conjunctivae are normal. Sclera is non-icteric. EOMI. PERRL      Mouth/Throat: Mucous membranes are moist.       Neck: Supple with no signs of meningismus. Cardiovascular: Regular rate and rhythm. No murmurs, gallops, or rubs. 2+  symmetrical distal pulses are present in all extremities. No JVD. Respiratory: Normal respiratory effort. Lungs are clear to auscultation bilaterally. No wheezes, crackles, or rhonchi.  Musculoskeletal: Swelling and tenderness to palpation on the medial aspect of the left knee, full painless range of motion of her left hip, knee, ankle. No CT and L spine tenderness.  Neurologic: Normal speech and language. Face is symmetric. Moving all extremities. No gross focal neurologic deficits are appreciated. Skin: Skin is warm, dry and intact. No rash noted. Psychiatric: Mood and affect are normal. Speech and behavior are normal.  ____________________________________________   LABS (all labs ordered are listed, but only abnormal results are displayed)  Labs Reviewed - No data to display ____________________________________________  EKG  none ____________________________________________  RADIOLOGY  XR:   No acute fracture or dislocation ____________________________________________   PROCEDURES  Procedure(s) performed: None Critical Care performed:  None ____________________________________________   INITIAL IMPRESSION / ASSESSMENT AND PLAN / ED COURSE   82 y.o. female with a history of MS, hypertension, hypothyroidism who presents for evaluation of left knee pain status post mechanical fall. Patient has swelling and tenderness to palpation on the medial aspect of the left knee, she has full painless range of motion of the knee and hip and ankle. No other injuries noted on exam. X-rays pending. We'll treat pain with Tylenol and oxycodone.  XR negative for fracture or dislocation. Patient reports pain is mostly resolved after tylenol and oxycodone. Patient ambulated with walker. Patient uses walker at baseline at home. Patient was discharged to care of her two sons on standing tylenol, oxycodone PRN, senna-colace to prevent opiate-induced constipation, elevation, ice, rest, and close f/u  with PCP.  Pertinent labs & imaging results that were available during my care of the patient were reviewed by me and considered in my medical decision making (see chart for details).   I discussed my evaluation of the patient's symptoms, my clinical impression, and my proposed outpatient treatment plan with patient/ family members. We have discussed anticipatory guidance, scheduled follow-up, and careful return precautions. The patient expresses understanding and is comfortable with the discharge plan. All patient's questions were answered.   ____________________________________________   FINAL CLINICAL IMPRESSION(S) / ED DIAGNOSES  Final diagnoses:  Left knee pain  Fall, initial encounter      NEW MEDICATIONS STARTED DURING THIS VISIT:  Discharge Medication List as of 12/31/2015  7:44 PM    START taking these medications   Details  oxyCODONE (ROXICODONE) 5 MG immediate release tablet Take 1 tablet (5  mg total) by mouth every 6 (six) hours as needed for moderate pain or severe pain., Starting 12/31/2015, Until Sat 12/30/16, Print    senna-docusate (SENOKOT-S) 8.6-50 MG tablet Take 1 tablet by mouth daily., Starting 12/31/2015, Until Sat 12/30/16, Print         Note:  This document was prepared using Dragon voice recognition software and may include unintentional dictation errors.    Nita Sickle, MD 12/31/15 2205

## 2015-12-31 NOTE — Discharge Instructions (Signed)
You were seen for knee pain. Your x-ray was negative for fracture. Take 1000 mg Tylenol every 8 hours. Take 5 mg of oxycodone every 6 hours as needed for breakthrough pain. Take 1 senna-docusate every day today you take oxycodone to prevent constipation. Follow up with her doctor Monday. Rest and elevate her knee, apply ice, use her walker at home. Return to the emergency Department if they have worsening pain.

## 2015-12-31 NOTE — ED Notes (Signed)
Nursing communication addressed

## 2015-12-31 NOTE — ED Notes (Signed)
Pt to ED via [facility] from independent living at the [facility] c/o fall. Per EMS pt ambulates with a walker and fell Wednesday night in her bathroom and was not treated. Pt left knee pain got progressively worse and she was unable to ambulate today. Pt alert and oriented in no acute distress at this time.

## 2016-12-30 ENCOUNTER — Emergency Department: Payer: Medicare Other

## 2016-12-30 ENCOUNTER — Inpatient Hospital Stay
Admission: EM | Admit: 2016-12-30 | Discharge: 2017-01-02 | DRG: 481 | Disposition: A | Discharge status: Skilled Nursing Facility | Payer: Medicare Other | Attending: Internal Medicine | Admitting: Internal Medicine

## 2016-12-30 DIAGNOSIS — W010XXA Fall on same level from slipping, tripping and stumbling without subsequent striking against object, initial encounter: Secondary | ICD-10-CM | POA: Diagnosis present

## 2016-12-30 DIAGNOSIS — Z79899 Other long term (current) drug therapy: Secondary | ICD-10-CM

## 2016-12-30 DIAGNOSIS — D62 Acute posthemorrhagic anemia: Secondary | ICD-10-CM | POA: Diagnosis not present

## 2016-12-30 DIAGNOSIS — G35 Multiple sclerosis: Secondary | ICD-10-CM | POA: Diagnosis present

## 2016-12-30 DIAGNOSIS — S72002A Fracture of unspecified part of neck of left femur, initial encounter for closed fracture: Secondary | ICD-10-CM | POA: Diagnosis present

## 2016-12-30 DIAGNOSIS — Y92099 Unspecified place in other non-institutional residence as the place of occurrence of the external cause: Secondary | ICD-10-CM

## 2016-12-30 DIAGNOSIS — S72142A Displaced intertrochanteric fracture of left femur, initial encounter for closed fracture: Principal | ICD-10-CM | POA: Diagnosis present

## 2016-12-30 DIAGNOSIS — E039 Hypothyroidism, unspecified: Secondary | ICD-10-CM | POA: Diagnosis present

## 2016-12-30 DIAGNOSIS — I10 Essential (primary) hypertension: Secondary | ICD-10-CM | POA: Diagnosis present

## 2016-12-30 HISTORY — DX: Essential (primary) hypertension: I10

## 2016-12-30 HISTORY — DX: Hypothyroidism, unspecified: E03.9

## 2016-12-30 LAB — CBC WITH DIFFERENTIAL/PLATELET
BASOS ABS: 0.1 10*3/uL (ref 0–0.1)
BASOS PCT: 1 %
EOS ABS: 0.2 10*3/uL (ref 0–0.7)
Eosinophils Relative: 3 %
HCT: 36.7 % (ref 35.0–47.0)
Hemoglobin: 12.5 g/dL (ref 12.0–16.0)
Lymphocytes Relative: 23 %
Lymphs Abs: 1.4 10*3/uL (ref 1.0–3.6)
MCH: 34.6 pg — ABNORMAL HIGH (ref 26.0–34.0)
MCHC: 34.2 g/dL (ref 32.0–36.0)
MCV: 101.3 fL — ABNORMAL HIGH (ref 80.0–100.0)
MONO ABS: 0.6 10*3/uL (ref 0.2–0.9)
MONOS PCT: 10 %
NEUTROS ABS: 3.8 10*3/uL (ref 1.4–6.5)
NEUTROS PCT: 63 %
Platelets: 245 10*3/uL (ref 150–440)
RBC: 3.62 MIL/uL — ABNORMAL LOW (ref 3.80–5.20)
RDW: 13.4 % (ref 11.5–14.5)
WBC: 6 10*3/uL (ref 3.6–11.0)

## 2016-12-30 LAB — URINALYSIS, COMPLETE (UACMP) WITH MICROSCOPIC
Bacteria, UA: NONE SEEN
Bilirubin Urine: NEGATIVE
GLUCOSE, UA: NEGATIVE mg/dL
Ketones, ur: NEGATIVE mg/dL
LEUKOCYTES UA: NEGATIVE
NITRITE: NEGATIVE
PH: 5 (ref 5.0–8.0)
PROTEIN: 30 mg/dL — AB
SPECIFIC GRAVITY, URINE: 1.015 (ref 1.005–1.030)

## 2016-12-30 LAB — TYPE AND SCREEN
ABO/RH(D): A POS
ANTIBODY SCREEN: NEGATIVE

## 2016-12-30 LAB — BASIC METABOLIC PANEL
ANION GAP: 6 (ref 5–15)
BUN: 28 mg/dL — ABNORMAL HIGH (ref 6–20)
CHLORIDE: 103 mmol/L (ref 101–111)
CO2: 28 mmol/L (ref 22–32)
Calcium: 8.9 mg/dL (ref 8.9–10.3)
Creatinine, Ser: 1.15 mg/dL — ABNORMAL HIGH (ref 0.44–1.00)
GFR calc Af Amer: 46 mL/min — ABNORMAL LOW (ref >60)
GFR, EST NON AFRICAN AMERICAN: 39 mL/min — AB (ref >60)
GLUCOSE: 101 mg/dL — AB (ref 65–99)
POTASSIUM: 4 mmol/L (ref 3.5–5.1)
Sodium: 137 mmol/L (ref 135–145)

## 2016-12-30 LAB — PROTIME-INR
INR: 1.02
Prothrombin Time: 13.4 seconds (ref 11.4–15.2)

## 2016-12-30 LAB — APTT: APTT: 28 s (ref 24–36)

## 2016-12-30 MED ORDER — ATENOLOL 25 MG PO TABS
50.0000 mg | ORAL_TABLET | Freq: Every day | ORAL | Status: DC
  Administered 2016-12-31: 50 mg via ORAL
  Filled 2016-12-30: qty 2

## 2016-12-30 MED ORDER — ACETAMINOPHEN 325 MG PO TABS: 650.0000 mg | ORAL_TABLET | Freq: Four times a day (QID) | ORAL | Status: DC | PRN

## 2016-12-30 MED ORDER — ASPIRIN EC 81 MG PO TBEC: 81.0000 mg | DELAYED_RELEASE_TABLET | Freq: Every day | ORAL | Status: DC

## 2016-12-30 MED ORDER — SODIUM CHLORIDE 0.9 % IV SOLN
INTRAVENOUS | Status: DC
  Administered 2016-12-30 – 2016-12-31 (×3): via INTRAVENOUS

## 2016-12-30 MED ORDER — ACETAMINOPHEN 650 MG RE SUPP: 650.0000 mg | Freq: Four times a day (QID) | RECTAL | Status: DC | PRN

## 2016-12-30 MED ORDER — ONDANSETRON HCL 4 MG/2ML IJ SOLN: 4.0000 mg | Freq: Four times a day (QID) | INTRAMUSCULAR | Status: DC | PRN

## 2016-12-30 MED ORDER — BISACODYL 10 MG RE SUPP: 10.0000 mg | Freq: Every day | RECTAL | Status: DC | PRN

## 2016-12-30 MED ORDER — FENTANYL CITRATE (PF) 100 MCG/2ML IJ SOLN
25.0000 ug | Freq: Once | INTRAMUSCULAR | Status: AC
  Administered 2016-12-30: 25 ug via INTRAVENOUS
  Filled 2016-12-30: qty 2

## 2016-12-30 MED ORDER — VITAMIN D 1000 UNITS PO TABS: 1000.0000 [IU] | ORAL_TABLET | Freq: Every day | ORAL | Status: DC

## 2016-12-30 MED ORDER — ENOXAPARIN SODIUM 30 MG/0.3ML ~~LOC~~ SOLN: 30.0000 mg | SUBCUTANEOUS | Status: DC

## 2016-12-30 MED ORDER — OXYCODONE HCL 5 MG PO TABS: 5.0000 mg | ORAL_TABLET | Freq: Four times a day (QID) | ORAL | Status: DC | PRN

## 2016-12-30 MED ORDER — CEFAZOLIN SODIUM-DEXTROSE 2-4 GM/100ML-% IV SOLN
2.0000 g | INTRAVENOUS | Status: AC
  Administered 2016-12-31: 2 g via INTRAVENOUS
  Filled 2016-12-30: qty 100

## 2016-12-30 MED ORDER — DOCUSATE SODIUM 100 MG PO CAPS: 100.0000 mg | ORAL_CAPSULE | Freq: Two times a day (BID) | ORAL | Status: DC

## 2016-12-30 MED ORDER — MORPHINE SULFATE (PF) 2 MG/ML IV SOLN
2.0000 mg | INTRAVENOUS | Status: DC | PRN
  Administered 2016-12-30 – 2016-12-31 (×3): 2 mg via INTRAVENOUS
  Filled 2016-12-30 (×3): qty 1

## 2016-12-30 MED ORDER — HYDRALAZINE HCL 20 MG/ML IJ SOLN: 5.0000 mg | INTRAMUSCULAR | Status: DC | PRN

## 2016-12-30 MED ORDER — LEVOTHYROXINE SODIUM 50 MCG PO TABS: 50.0000 ug | ORAL_TABLET | Freq: Every day | ORAL | Status: DC

## 2016-12-30 MED ORDER — PANTOPRAZOLE SODIUM 40 MG PO TBEC: 40.0000 mg | DELAYED_RELEASE_TABLET | Freq: Every day | ORAL | Status: DC

## 2016-12-30 MED ORDER — DEXTROSE 5 % IV SOLN
600.0000 mg | INTRAVENOUS | Status: AC
  Administered 2016-12-31: 600 mg via INTRAVENOUS
  Filled 2016-12-30: qty 4

## 2016-12-30 MED ORDER — ONDANSETRON HCL 4 MG PO TABS: 4.0000 mg | ORAL_TABLET | Freq: Four times a day (QID) | ORAL | Status: DC | PRN

## 2016-12-30 NOTE — ED Triage Notes (Signed)
Pt came to ED via EMS from [facility] independent apartments. Pt took mechanical fall after standing up from recliner without socks. Pt alert and oriented, c/o left sided hip/inner thigh pain.

## 2016-12-30 NOTE — H&P (Signed)
History and Physical    Caitlin Colon PFY:[id number] DOB: 1919/10/05 DOA: 12/30/2016  Referring physician: Dr. Burlene Arnt PCP: Lynnell Jude, MD  Specialists: none  Chief Complaint:  Left hip pain  HPI: Caitlin Colon is a 83 y.o. female has a past medical history significant for MS, HTN and thyroid disease now with mechanical fall and left hip pain. Brought to ER where plain films reveal left hip fx. Labs and CXR OK. Denies cardiac hx. Does have hx of MS and left-sided weakness. No fever. Denies CP or SOB. No palpitations or syncope. She is now admitted.  Review of Systems: The patient denies anorexia, fever, weight loss,, vision loss, decreased hearing, hoarseness, chest pain, syncope, dyspnea on exertion, peripheral edema, balance deficits, hemoptysis, abdominal pain, melena, hematochezia, severe indigestion/heartburn, hematuria, incontinence, genital sores, muscle weakness, suspicious skin lesions, transient blindness,  depression, unusual weight change, abnormal bleeding, enlarged lymph nodes, angioedema, and breast masses. Lives in independent living at [place]  Past Medical History:  Diagnosis Date  . Hypertension   . Hypothyroidism   . Multiple sclerosis ([place])    Past Surgical History:  Procedure Laterality Date  . VITRECTOMY     Social History:  reports that she has never smoked. She has never used smokeless tobacco. She reports that she does not drink alcohol or use drugs.  No Known Allergies  History reviewed. No pertinent family history.  Prior to Admission medications   Medication Sig Start Date End Date Taking? Authorizing Provider  acetaminophen (TYLENOL) 500 MG tablet Take 500 mg by mouth every 6 (six) hours as needed.   Yes [provider]  atenolol (TENORMIN) 50 MG tablet Take 50 mg by mouth daily.   Yes [provider]  cholecalciferol (VITAMIN D) 1000 units tablet Take 1,000 Units by mouth daily.   Yes [provider]  Cyanocobalamin 1000  MCG/ML KIT Inject 1 mL as directed every 30 (thirty) days.   Yes [provider]  levothyroxine (SYNTHROID, LEVOTHROID) 50 MCG tablet Take 50 mcg by mouth daily before breakfast.   Yes [provider]  ondansetron (ZOFRAN) 4 MG tablet Take 1 tablet (4 mg total) by mouth every 6 (six) hours as needed for nausea. Patient not taking: Reported on 12/30/2016 12/25/15   Vaughan Basta, MD  oxyCODONE (ROXICODONE) 5 MG immediate release tablet Take 1 tablet (5 mg total) by mouth every 6 (six) hours as needed for moderate pain or severe pain. Patient not taking: Reported on 12/30/2016 12/31/15 12/30/16  Rudene Re, MD  senna-docusate (SENOKOT-S) 8.6-50 MG tablet Take 1 tablet by mouth daily. Patient not taking: Reported on 12/30/2016 12/31/15 12/30/16  Rudene Re, MD   Physical Exam: Vitals:   12/30/16 1543  BP: (!) 163/83  Pulse: (!) 55  Resp: 14  Temp: 98.5 F (36.9 C)  TempSrc: Oral  SpO2: 96%  Weight: 49.9 kg (110 lb)  Height: 5' (1.524 m)     General:  No apparent distress, WDWN, [place]/AT  Eyes: PERRL, EOMI, no scleral icterus, conjunctiva clear  ENT: moist oropharynx without exudate, TM's benign, dentition fair  Neck: supple, no lymphadenopathy. No bruits or thyromegaly  Cardiovascular: regular rate without MRG; 2+ peripheral pulses, no JVD, no peripheral edema  Respiratory: CTA biL, good air movement without wheezing, rhonchi or crackled. Respiratory effort normal  Abdomen: soft, non tender to palpation, positive bowel sounds, no guarding, no rebound  Skin: no rashes or lesions  Musculoskeletal: normal bulk and tone, no joint swelling  Psychiatric: normal mood  and affect, A&OX3  Neurologic: CN 2-12 grossly intact, Motor strength 5/5 in all 4 groups with symmetric DTR's and non-focal sensory exam  Labs on Admission:  Basic Metabolic Panel:  Recent Labs Lab 12/30/16 1556  NA 137  K 4.0  CL 103  CO2 28  GLUCOSE 101*  BUN 28*  CREATININE  1.15*  CALCIUM 8.9   Liver Function Tests: No results for input(s): AST, ALT, ALKPHOS, BILITOT, PROT, ALBUMIN in the last 168 hours. No results for input(s): LIPASE, AMYLASE in the last 168 hours. No results for input(s): AMMONIA in the last 168 hours. CBC:  Recent Labs Lab 12/30/16 1556  WBC 6.0  NEUTROABS 3.8  HGB 12.5  HCT 36.7  MCV 101.3*  PLT 245   Cardiac Enzymes: No results for input(s): CKTOTAL, CKMB, CKMBINDEX, TROPONINI in the last 168 hours.  BNP (last 3 results) No results for input(s): BNP in the last 8760 hours.  ProBNP (last 3 results) No results for input(s): PROBNP in the last 8760 hours.  CBG: No results for input(s): GLUCAP in the last 168 hours.  Radiological Exams on Admission: Dg Chest 1 View  Result Date: 12/30/2016 CLINICAL DATA:  Pain following fall EXAM: CHEST 1 VIEW COMPARISON:  None. FINDINGS: There is no edema or consolidation. Heart is mildly enlarged with pulmonary vascularity within normal limits. There is aortic atherosclerosis. No adenopathy. No pneumothorax. No evident bone lesions. IMPRESSION: Mild cardiomegaly. Aortic atherosclerosis. No edema or consolidation. Aortic Atherosclerosis (ICD10-I70.0). Electronically Signed   By: Lowella Grip III M.D.   On: 12/30/2016 16:42   Dg Hip Unilat W Or Wo Pelvis 2-3 Views Left  Result Date: 12/30/2016 CLINICAL DATA:  Fall.  Left-sided hip pain. EXAM: DG HIP (WITH OR WITHOUT PELVIS) 2-3V LEFT COMPARISON:  None. FINDINGS: Frontal pelvis with AP and frog-leg lateral views of the left hip show a comminuted left intertrochanteric femoral neck fracture. Varus angulation associated. Bones are diffusely demineralized. IMPRESSION: Comminuted left intertrochanteric femoral neck fracture with varus angulation. Electronically Signed   By: Misty Stanley M.D.   On: 12/30/2016 16:45    EKG: Independently reviewed.  Assessment/Plan Principal Problem:   Closed left hip fracture (HCC) Active Problems:    Multiple sclerosis ([place])   Essential hypertension   Hypothyroidism   Will admit to floor with IV morphine as needed. Consult Ortho, PT, and CSW. Continue outpatient meds. Repeat labs in AM.  Diet: low salt Fluids: NS@75  DVT Prophylaxis: Lovenox  Code Status: FULL  Family Communication: yes  Disposition Plan: SNF  Time spent: 55 min

## 2016-12-30 NOTE — ED Provider Notes (Addendum)
[facility] Emergency Department Provider Note  ____________________________________________   I have reviewed the triage vital signs and the nursing notes.   HISTORY  Chief Complaint Fall    HPI Caitlin Colon is a 83 y.o. female who is very healthy at baseline, not on blood thinners, lives in assisted living facility, doesn't history frequent falls, has history of MS, she fell today, non-syncopal. She simply turned too quickly on a hardwood floor and lost her footing and landed on her head. She did not hit her head she did not pass out. She was able to push her life alert button and had help come to her immediately. She has pain in her left hip she has no other injury or complaint. The pain is sharp, it doesn't hurt unless she moves it, does not radiate, she did not have a prolonged downtime.      Past Medical History:  Diagnosis Date  . Multiple sclerosis [facility])     Patient Active Problem List   Diagnosis Date Noted  . Acute gallstone pancreatitis 12/25/2015  . Essential hypertension 12/25/2015  . Hypothyroidism 12/25/2015  . Pancreatitis 12/24/2015  . Cholelithiasis 12/24/2015  . Transaminitis 12/24/2015  . Multiple sclerosis (HCC) 12/24/2015    Past Surgical History:  Procedure Laterality Date  . VITRECTOMY      Prior to Admission medications   Medication Sig Start Date End Date Taking? Authorizing Provider  aspirin 325 MG EC tablet Take 325 mg by mouth at bedtime as needed for pain.    [provider]  atenolol (TENORMIN) 50 MG tablet Take 50 mg by mouth daily.    [provider]  levothyroxine (SYNTHROID, LEVOTHROID) 50 MCG tablet Take 50 mcg by mouth daily before breakfast.    [provider]  ondansetron (ZOFRAN) 4 MG tablet Take 1 tablet (4 mg total) by mouth every 6 (six) hours as needed for nausea. Patient not taking: Reported on 12/30/2016 12/25/15   Altamese Dilling, MD  oxyCODONE (ROXICODONE) 5 MG  immediate release tablet Take 1 tablet (5 mg total) by mouth every 6 (six) hours as needed for moderate pain or severe pain. Patient not taking: Reported on 12/30/2016 12/31/15 12/30/16  Nita Sickle, MD  senna-docusate (SENOKOT-S) 8.6-50 MG tablet Take 1 tablet by mouth daily. Patient not taking: Reported on 12/30/2016 12/31/15 12/30/16  Nita Sickle, MD    Allergies Atenolol  No family history on file.  Social History Social History  Substance Use Topics  . Smoking status: Never Smoker  . Smokeless tobacco: Not on file  . Alcohol use No    Review of Systems Constitutional: No fever/chills Eyes: No visual changes. ENT: No sore throat. No stiff neck no neck pain Cardiovascular: Denies chest pain. Respiratory: Denies shortness of breath. Gastrointestinal:   no vomiting.  No diarrhea.  No constipation. Genitourinary: Negative for dysuria. Musculoskeletal: Negative lower extremity swelling Skin: Negative for rash. Neurological: Negative for severe headaches, focal weakness or numbness.   ____________________________________________   PHYSICAL EXAM:  VITAL SIGNS: ED Triage Vitals [12/30/16 1543]  Enc Vitals Group     BP (!) 163/83     Pulse Rate (!) 55     Resp 14     Temp 98.5 F (36.9 C)     Temp Source Oral     SpO2 96 %     Weight 110 lb (49.9 kg)     Height 5' (1.524 m)     Head Circumference      Peak Flow  Pain Score      Pain Loc      Pain Edu?      Excl. in GC?     Constitutional: Alert and oriented. Well appearing and in no acute distress. Eyes: Conjunctivae are normal Head: Atraumatic HEENT: No congestion/rhinnorhea. Mucous membranes are moist.  Oropharynx non-erythematous Neck:   Nontender with no meningismus, no masses, no stridor Cardiovascular: Normal rate, regular rhythm. Grossly normal heart sounds.  Good peripheral circulation. Respiratory: Normal respiratory effort.  No retractions. Lungs CTAB. Abdominal: Soft and nontender. No  distention. No guarding no rebound Back:  There is no focal tenderness or step off.  there is no midline tenderness there are no lesions noted. there is no CVA tenderness Musculoskeletal: Patient has tenderness to palpation left hip and minimal pain with ranging, does not appear to be open, there is no knee pain or ankle pain, no upper extremity tenderness. No joint effusions, no DVT signs strong distal pulses no edema Neurologic:  Normal speech and language. No gross focal neurologic deficits are appreciated.  Skin:  Skin is warm, dry and intact. No rash noted. Psychiatric: Mood and affect are normal. Speech and behavior are normal.  ____________________________________________   LABS (all labs ordered are listed, but only abnormal results are displayed)  Labs Reviewed  CBC WITH DIFFERENTIAL/PLATELET - Abnormal; Notable for the following:       Result Value   RBC 3.62 (*)    MCV 101.3 (*)    MCH 34.6 (*)    All other components within normal limits  BASIC METABOLIC PANEL - Abnormal; Notable for the following:    Glucose, Bld 101 (*)    BUN 28 (*)    Creatinine, Ser 1.15 (*)    GFR calc non Af Amer 39 (*)    GFR calc Af Amer 46 (*)    All other components within normal limits  URINALYSIS, COMPLETE (UACMP) WITH MICROSCOPIC   ____________________________________________  EKG  I personally interpreted any EKGs ordered by me or triage Screening EKG shows sinus rhythm rate 51 bpm, bradycardia noted, nonspecific ST changes noted. No acute ischemic changes. Borderline right bundle branch block. ____________________________________________  RADIOLOGY  I reviewed any imaging ordered by me or triage that were performed during my shift and, if possible, patient and/or family made aware of any abnormal findings. ____________________________________________   PROCEDURES  Procedure(s) performed: None  Procedures  Critical Care performed:  None  ____________________________________________   INITIAL IMPRESSION / ASSESSMENT AND PLAN / ED COURSE  Pertinent labs & imaging results that were available during my care of the patient were reviewed by me and considered in my medical decision making (see chart for details).  Patient here after a non-syncopal fall, has a hip fracture by my read, preoperative has been initiated and we will admit her to the hospital. Dr. Hyacinth Meeker made aware of the patient. In addition, there is no evidence to suggest syncopal symptoms or closed head injury and don't think CT scan is indicated at this time. Patient very well controlled on pain without moving, she needs pain medication we'll certainly give it to her.  ----------------------------------------- 5:00 PM on 12/30/2016 -----------------------------------------  Patient very comfortable and less movement. We will have to give her a Foley catheter obviously. We'll give her fentanyl before. We will try to titrated given her age. Dr. Judithann Sheen aware of patient. Dr. Hyacinth Meeker states she'll try to operate on her tomorrow morning at 9:30.    ____________________________________________   FINAL CLINICAL IMPRESSION(S) / ED  DIAGNOSES  Final diagnoses:  None      This chart was dictated using voice recognition software.  Despite best efforts to proofread,  errors can occur which can change meaning.      Jeanmarie Plant, MD 12/30/16 1644    Jeanmarie Plant, MD 12/30/16 [phone]

## 2016-12-31 ENCOUNTER — Inpatient Hospital Stay: Payer: Medicare Other | Admitting: Anesthesiology

## 2016-12-31 ENCOUNTER — Encounter
Admission: EM | Disposition: A | Discharge status: Skilled Nursing Facility | Payer: Self-pay | Source: Home / Self Care | Attending: Internal Medicine

## 2016-12-31 ENCOUNTER — Encounter: Payer: Self-pay | Admitting: Anesthesiology

## 2016-12-31 ENCOUNTER — Inpatient Hospital Stay: Payer: Medicare Other

## 2016-12-31 HISTORY — PX: INTRAMEDULLARY (IM) NAIL INTERTROCHANTERIC: SHX5875

## 2016-12-31 LAB — COMPREHENSIVE METABOLIC PANEL
ALT: 11 U/L — AB (ref 14–54)
AST: 19 U/L (ref 15–41)
Albumin: 3.2 g/dL — ABNORMAL LOW (ref 3.5–5.0)
Alkaline Phosphatase: 46 U/L (ref 38–126)
Anion gap: 2 — ABNORMAL LOW (ref 5–15)
BILIRUBIN TOTAL: 0.7 mg/dL (ref 0.3–1.2)
BUN: 24 mg/dL — AB (ref 6–20)
CHLORIDE: 108 mmol/L (ref 101–111)
CO2: 27 mmol/L (ref 22–32)
CREATININE: 0.8 mg/dL (ref 0.44–1.00)
Calcium: 8.5 mg/dL — ABNORMAL LOW (ref 8.9–10.3)
GFR calc Af Amer: >60 mL/min (ref >60)
Glucose, Bld: 113 mg/dL — ABNORMAL HIGH (ref 65–99)
Potassium: 4 mmol/L (ref 3.5–5.1)
Sodium: 137 mmol/L (ref 135–145)
TOTAL PROTEIN: 6 g/dL — AB (ref 6.5–8.1)

## 2016-12-31 LAB — CBC
HCT: 30.4 % — ABNORMAL LOW (ref 35.0–47.0)
Hemoglobin: 10.6 g/dL — ABNORMAL LOW (ref 12.0–16.0)
MCH: 35 pg — ABNORMAL HIGH (ref 26.0–34.0)
MCHC: 34.9 g/dL (ref 32.0–36.0)
MCV: 100.3 fL — AB (ref 80.0–100.0)
PLATELETS: 190 10*3/uL (ref 150–440)
RBC: 3.03 MIL/uL — ABNORMAL LOW (ref 3.80–5.20)
RDW: 13.2 % (ref 11.5–14.5)
WBC: 7.1 10*3/uL (ref 3.6–11.0)

## 2016-12-31 LAB — TSH: TSH: 3.269 u[IU]/mL (ref 0.350–4.500)

## 2016-12-31 LAB — SURGICAL PCR SCREEN
MRSA, PCR: NEGATIVE
Staphylococcus aureus: NEGATIVE

## 2016-12-31 SURGERY — FIXATION, FRACTURE, INTERTROCHANTERIC, WITH INTRAMEDULLARY ROD
Anesthesia: General | Laterality: Left

## 2016-12-31 MED ORDER — GLYCOPYRROLATE 0.2 MG/ML IJ SOLN
INTRAMUSCULAR | Status: AC
  Filled 2016-12-31: qty 1

## 2016-12-31 MED ORDER — MAGNESIUM HYDROXIDE 400 MG/5ML PO SUSP
30.0000 mL | Freq: Every day | ORAL | Status: DC | PRN
  Administered 2017-01-01 – 2017-01-02 (×2): 30 mL via ORAL
  Filled 2016-12-31 (×2): qty 30

## 2016-12-31 MED ORDER — ONDANSETRON HCL 4 MG/2ML IJ SOLN
INTRAMUSCULAR | Status: DC | PRN
  Administered 2016-12-31: 4 mg via INTRAVENOUS

## 2016-12-31 MED ORDER — BUPIVACAINE-EPINEPHRINE (PF) 0.25% -1:200000 IJ SOLN
INTRAMUSCULAR | Status: AC
  Filled 2016-12-31: qty 30

## 2016-12-31 MED ORDER — BISACODYL 10 MG RE SUPP
10.0000 mg | Freq: Every day | RECTAL | Status: DC | PRN
  Administered 2017-01-02: 10 mg via RECTAL
  Filled 2016-12-31: qty 1

## 2016-12-31 MED ORDER — MORPHINE SULFATE (PF) 2 MG/ML IV SOLN: 1.0000 mg | INTRAVENOUS | Status: DC | PRN

## 2016-12-31 MED ORDER — FENTANYL CITRATE (PF) 100 MCG/2ML IJ SOLN
INTRAMUSCULAR | Status: AC
  Filled 2016-12-31: qty 2

## 2016-12-31 MED ORDER — LIDOCAINE HCL (PF) 2 % IJ SOLN
INTRAMUSCULAR | Status: AC
  Filled 2016-12-31: qty 2

## 2016-12-31 MED ORDER — PHENOL 1.4 % MT LIQD
1.0000 | OROMUCOSAL | Status: DC | PRN
  Filled 2016-12-31: qty 177

## 2016-12-31 MED ORDER — PROPOFOL 10 MG/ML IV BOLUS
INTRAVENOUS | Status: AC
  Filled 2016-12-31: qty 20

## 2016-12-31 MED ORDER — METOCLOPRAMIDE HCL 10 MG PO TABS: 5.0000 mg | ORAL_TABLET | Freq: Three times a day (TID) | ORAL | Status: DC | PRN

## 2016-12-31 MED ORDER — CEFAZOLIN SODIUM-DEXTROSE 2-4 GM/100ML-% IV SOLN
2.0000 g | Freq: Three times a day (TID) | INTRAVENOUS | Status: AC
  Administered 2016-12-31 – 2017-01-01 (×3): 2 g via INTRAVENOUS
  Filled 2016-12-31 (×3): qty 100

## 2016-12-31 MED ORDER — ZOLPIDEM TARTRATE 5 MG PO TABS: 5.0000 mg | ORAL_TABLET | Freq: Every evening | ORAL | Status: DC | PRN

## 2016-12-31 MED ORDER — METOCLOPRAMIDE HCL 5 MG/ML IJ SOLN
5.0000 mg | Freq: Three times a day (TID) | INTRAMUSCULAR | Status: DC | PRN
Start: 2016-12-31 — End: 2017-01-02

## 2016-12-31 MED ORDER — ACETAMINOPHEN 500 MG PO TABS: 1000.0000 mg | ORAL_TABLET | Freq: Four times a day (QID) | ORAL | Status: AC | PRN

## 2016-12-31 MED ORDER — NEOMYCIN-POLYMYXIN B GU 40-200000 IR SOLN
Status: AC
  Filled 2016-12-31: qty 2

## 2016-12-31 MED ORDER — PHENYLEPHRINE HCL 10 MG/ML IJ SOLN
INTRAMUSCULAR | Status: AC
  Filled 2016-12-31: qty 1

## 2016-12-31 MED ORDER — NEOMYCIN-POLYMYXIN B GU 40-200000 IR SOLN
Status: DC | PRN
  Administered 2016-12-31: 2 mL

## 2016-12-31 MED ORDER — FERROUS SULFATE 325 (65 FE) MG PO TABS
325.0000 mg | ORAL_TABLET | Freq: Every day | ORAL | Status: DC
  Administered 2017-01-01 – 2017-01-02 (×2): 325 mg via ORAL
  Filled 2016-12-31 (×2): qty 1

## 2016-12-31 MED ORDER — LACTATED RINGERS IV SOLN
INTRAVENOUS | Status: DC | PRN
  Administered 2016-12-31: 11:00:00 via INTRAVENOUS

## 2016-12-31 MED ORDER — MENTHOL 3 MG MT LOZG
1.0000 | LOZENGE | OROMUCOSAL | Status: DC | PRN
  Filled 2016-12-31: qty 9

## 2016-12-31 MED ORDER — ALUM & MAG HYDROXIDE-SIMETH 200-200-20 MG/5ML PO SUSP
30.0000 mL | ORAL | Status: DC | PRN
Start: 2016-12-31 — End: 2017-01-02

## 2016-12-31 MED ORDER — EPHEDRINE SULFATE 50 MG/ML IJ SOLN
INTRAMUSCULAR | Status: DC | PRN
  Administered 2016-12-31 (×3): 10 mg via INTRAVENOUS

## 2016-12-31 MED ORDER — SODIUM CHLORIDE 0.45 % IV SOLN
INTRAVENOUS | Status: DC
  Administered 2016-12-31 – 2017-01-01 (×2): via INTRAVENOUS

## 2016-12-31 MED ORDER — ENOXAPARIN SODIUM 30 MG/0.3ML ~~LOC~~ SOLN
30.0000 mg | SUBCUTANEOUS | Status: DC
  Administered 2017-01-01 – 2017-01-02 (×2): 30 mg via SUBCUTANEOUS
  Filled 2016-12-31 (×2): qty 0.3

## 2016-12-31 MED ORDER — SENNA 8.6 MG PO TABS
1.0000 | ORAL_TABLET | Freq: Two times a day (BID) | ORAL | Status: DC
  Administered 2016-12-31 – 2017-01-02 (×4): 8.6 mg via ORAL
  Filled 2016-12-31 (×3): qty 1

## 2016-12-31 MED ORDER — BUPIVACAINE-EPINEPHRINE (PF) 0.5% -1:200000 IJ SOLN
INTRAMUSCULAR | Status: DC | PRN
  Administered 2016-12-31: 30 mL

## 2016-12-31 MED ORDER — FENTANYL CITRATE (PF) 100 MCG/2ML IJ SOLN: 25.0000 ug | INTRAMUSCULAR | Status: DC | PRN

## 2016-12-31 MED ORDER — EPHEDRINE SULFATE 50 MG/ML IJ SOLN
INTRAMUSCULAR | Status: AC
Start: 2016-12-31 — End: 2016-12-31
  Filled 2016-12-31: qty 1

## 2016-12-31 MED ORDER — GLYCOPYRROLATE 0.2 MG/ML IJ SOLN
INTRAMUSCULAR | Status: DC | PRN
  Administered 2016-12-31: 0.1 mg via INTRAVENOUS

## 2016-12-31 MED ORDER — PROPOFOL 10 MG/ML IV BOLUS
INTRAVENOUS | Status: DC | PRN
  Administered 2016-12-31: 80 mg via INTRAVENOUS
  Administered 2016-12-31: 20 mg via INTRAVENOUS

## 2016-12-31 MED ORDER — ACETAMINOPHEN 650 MG RE SUPP: 650.0000 mg | Freq: Four times a day (QID) | RECTAL | Status: DC | PRN

## 2016-12-31 MED ORDER — HYDROCODONE-ACETAMINOPHEN 5-325 MG PO TABS
1.0000 | ORAL_TABLET | Freq: Four times a day (QID) | ORAL | Status: DC | PRN
  Administered 2016-12-31 – 2017-01-02 (×4): 1 via ORAL
  Filled 2016-12-31 (×4): qty 1

## 2016-12-31 MED ORDER — ONDANSETRON HCL 4 MG/2ML IJ SOLN: 4.0000 mg | Freq: Four times a day (QID) | INTRAMUSCULAR | Status: DC | PRN

## 2016-12-31 MED ORDER — ACETAMINOPHEN 325 MG PO TABS: 650.0000 mg | ORAL_TABLET | Freq: Four times a day (QID) | ORAL | Status: DC | PRN

## 2016-12-31 MED ORDER — ONDANSETRON HCL 4 MG/2ML IJ SOLN
4.0000 mg | Freq: Once | INTRAMUSCULAR | Status: DC | PRN
Start: 2016-12-31 — End: 2016-12-31

## 2016-12-31 MED ORDER — FLEET ENEMA 7-19 GM/118ML RE ENEM
1.0000 | ENEMA | Freq: Once | RECTAL | Status: AC | PRN
  Administered 2017-01-02: 1 via RECTAL

## 2016-12-31 MED ORDER — ONDANSETRON HCL 4 MG PO TABS: 4.0000 mg | ORAL_TABLET | Freq: Four times a day (QID) | ORAL | Status: DC | PRN

## 2016-12-31 MED ORDER — ONDANSETRON HCL 4 MG/2ML IJ SOLN
INTRAMUSCULAR | Status: AC
  Filled 2016-12-31: qty 2

## 2016-12-31 MED ORDER — FENTANYL CITRATE (PF) 100 MCG/2ML IJ SOLN
INTRAMUSCULAR | Status: DC | PRN
  Administered 2016-12-31 (×4): 25 ug via INTRAVENOUS

## 2016-12-31 MED ORDER — LIDOCAINE HCL (PF) 2 % IJ SOLN
INTRAMUSCULAR | Status: DC | PRN
  Administered 2016-12-31: 25 mg

## 2016-12-31 MED ORDER — CLINDAMYCIN PHOSPHATE 600 MG/50ML IV SOLN
600.0000 mg | Freq: Three times a day (TID) | INTRAVENOUS | Status: AC
  Administered 2016-12-31 – 2017-01-01 (×3): 600 mg via INTRAVENOUS
  Filled 2016-12-31 (×3): qty 50

## 2016-12-31 SURGICAL SUPPLY — 39 items
BIT DRILL CALIBRATED 4.2 (BIT) ×1 IMPLANT
BIT DRILL FLUTED FEMUR 4.2/3 (BIT) ×3 IMPLANT
BLADE HELICAL TI 85 STRL (Anchor) ×2 IMPLANT
BLADE HELICAL TI 85MM STRL (Anchor) ×1 IMPLANT
BNDG COHESIVE 4X5 TAN STRL (GAUZE/BANDAGES/DRESSINGS) ×3 IMPLANT
CANISTER SUCT 1200ML W/VALVE (MISCELLANEOUS) ×3 IMPLANT
CHLORAPREP W/TINT 26ML (MISCELLANEOUS) ×6 IMPLANT
DRAPE C-ARMOR (DRAPES) IMPLANT
DRAPE INCISE 23X17 IOBAN STRL (DRAPES) ×2
DRAPE INCISE IOBAN 23X17 STRL (DRAPES) ×1 IMPLANT
DRAPE TABLE BACK 80X90 (DRAPES) ×3 IMPLANT
DRILL BIT CALIBRATED 4.2 (BIT) ×3
DRSG AQUACEL AG ADV 3.5X10 (GAUZE/BANDAGES/DRESSINGS) ×3 IMPLANT
DRSG AQUACEL AG ADV 3.5X14 (GAUZE/BANDAGES/DRESSINGS) IMPLANT
ELECT REM PT RETURN 9FT ADLT (ELECTROSURGICAL) ×3
ELECTRODE REM PT RTRN 9FT ADLT (ELECTROSURGICAL) ×1 IMPLANT
GAUZE PETRO XEROFOAM 1X8 (MISCELLANEOUS) IMPLANT
GAUZE SPONGE 4X4 12PLY STRL (GAUZE/BANDAGES/DRESSINGS) ×3 IMPLANT
GLOVE INDICATOR 8.0 STRL GRN (GLOVE) ×3 IMPLANT
GLOVE SURG ORTHO 8.5 STRL (GLOVE) ×3 IMPLANT
GOWN STRL REUS W/ TWL LRG LVL3 (GOWN DISPOSABLE) ×1 IMPLANT
GOWN STRL REUS W/TWL LRG LVL3 (GOWN DISPOSABLE) ×2
GOWN STRL REUS W/TWL LRG LVL4 (GOWN DISPOSABLE) ×3 IMPLANT
GUIDEWIRE 3.2X400 (WIRE) ×6 IMPLANT
KIT RM TURNOVER STRD PROC AR (KITS) ×3 IMPLANT
MAT BLUE FLOOR 46X72 FLO (MISCELLANEOUS) ×3 IMPLANT
NAIL CANN FEM RT 12X170 130D (Nail) ×3 IMPLANT
NEEDLE SPNL 18GX3.5 QUINCKE PK (NEEDLE) ×3 IMPLANT
NS IRRIG 500ML POUR BTL (IV SOLUTION) ×3 IMPLANT
PACK HIP COMPR (MISCELLANEOUS) ×3 IMPLANT
REAMER ROD DEEP FLUTE 2.5X950 (INSTRUMENTS) ×3 IMPLANT
SCREW LOCKING 5.0X38MM (Screw) ×3 IMPLANT
STAPLER SKIN PROX 35W (STAPLE) ×3 IMPLANT
SUCTION FRAZIER HANDLE 10FR (MISCELLANEOUS) ×2
SUCTION TUBE FRAZIER 10FR DISP (MISCELLANEOUS) ×1 IMPLANT
SUT VIC AB 0 CT1 36 (SUTURE) ×3 IMPLANT
SUT VIC AB 2-0 CT1 27 (SUTURE) ×2
SUT VIC AB 2-0 CT1 TAPERPNT 27 (SUTURE) ×1 IMPLANT
SYR 30ML LL (SYRINGE) ×3 IMPLANT

## 2016-12-31 NOTE — Progress Notes (Signed)
Pt HR 54, MD Patel notified. Per MD ok to give scheduled atenolol.

## 2016-12-31 NOTE — NC FL2 (Signed)
[place] MEDICAID FL2 LEVEL OF CARE SCREENING TOOL     IDENTIFICATION  Patient Name: Caitlin Colon Birthdate: [birth year] Sex: female Admission Date (Current Location): 12/30/2016  [place] and [place] Number:  Chiropodist and Address:  [facility], [address] [place], [place] [address]      Provider Number: [id number]  Attending Physician Name and Address:  Enedina Finner, MD  Relative Name and Phone Number:       Current Level of Care: Hospital Recommended Level of Care: Skilled Nursing Facility Prior Approval Number:    Date Approved/Denied:   PASRR Number: [id number]  Discharge Plan: SNF    Current Diagnoses: Patient Active Problem List   Diagnosis Date Noted  . Closed left hip fracture (HCC) 12/30/2016  . Acute gallstone pancreatitis 12/25/2015  . Essential hypertension 12/25/2015  . Hypothyroidism 12/25/2015  . Pancreatitis 12/24/2015  . Cholelithiasis 12/24/2015  . Transaminitis 12/24/2015  . Multiple sclerosis (HCC) 12/24/2015    Orientation RESPIRATION BLADDER Height & Weight     Self, Time, Situation, Place  Normal Continent Weight: 111 lb 9.6 oz (50.6 kg) Height:  5' (152.4 cm)  BEHAVIORAL SYMPTOMS/MOOD NEUROLOGICAL BOWEL NUTRITION STATUS      Continent    AMBULATORY STATUS COMMUNICATION OF NEEDS Skin   Extensive Assist Verbally Surgical wounds                       Personal Care Assistance Level of Assistance  Bathing, Feeding, Dressing Bathing Assistance: Limited assistance Feeding assistance: Independent Dressing Assistance: Limited assistance     Functional Limitations Info             SPECIAL CARE FACTORS FREQUENCY  PT (By licensed PT)     PT Frequency: Up to 5X per day, 5 days per week              Contractures Contractures Info: Present    Additional Factors Info                  Current Medications (12/31/2016):  This is the current hospital active medication list Current  Facility-Administered Medications  Medication Dose Route Frequency Provider Last Rate Last Dose  . 0.9 %  sodium chloride infusion   Intravenous Continuous Marguarite Arbour, MD 75 mL/hr at 12/31/16 0820    . acetaminophen (TYLENOL) tablet 650 mg  650 mg Oral Q6H PRN Marguarite Arbour, MD       Or  . acetaminophen (TYLENOL) suppository 650 mg  650 mg Rectal Q6H PRN Marguarite Arbour, MD      . aspirin EC tablet 81 mg  81 mg Oral Daily Marguarite Arbour, MD      . atenolol (TENORMIN) tablet 50 mg  50 mg Oral Daily Marguarite Arbour, MD   50 mg at 12/31/16 0912  . bisacodyl (DULCOLAX) suppository 10 mg  10 mg Rectal Daily PRN Marguarite Arbour, MD      . ceFAZolin (ANCEF) IVPB 2g/100 mL premix  2 g Intravenous On Call to OR Deeann Saint, MD      . cholecalciferol (VITAMIN D) tablet 1,000 Units  1,000 Units Oral Daily Marguarite Arbour, MD      . clindamycin (CLEOCIN) 600 mg in dextrose 5 % 50 mL IVPB  600 mg Intravenous On Call to OR Deeann Saint, MD      . docusate sodium (COLACE) capsule 100 mg  100 mg Oral BID Marguarite Arbour, MD      .  enoxaparin (LOVENOX) injection 30 mg  30 mg Subcutaneous Q24H Aram Beecham D, MD      . hydrALAZINE (APRESOLINE) injection 5 mg  5 mg Intravenous Q4H PRN Marguarite Arbour, MD      . levothyroxine (SYNTHROID, LEVOTHROID) tablet 50 mcg  50 mcg Oral QAC breakfast Aram Beecham D, MD      . morphine 2 MG/ML injection 2 mg  2 mg Intravenous Q2H PRN Marguarite Arbour, MD   2 mg at 12/31/16 0911  . ondansetron (ZOFRAN) tablet 4 mg  4 mg Oral Q6H PRN Marguarite Arbour, MD       Or  . ondansetron (ZOFRAN) injection 4 mg  4 mg Intravenous Q6H PRN Marguarite Arbour, MD      . oxyCODONE (Oxy IR/ROXICODONE) immediate release tablet 5 mg  5 mg Oral Q6H PRN Marguarite Arbour, MD      . pantoprazole (PROTONIX) EC tablet 40 mg  40 mg Oral Daily Marguarite Arbour, MD         Discharge Medications: Please see discharge summary for a list of discharge  medications.  Relevant Imaging Results:  Relevant Lab Results:   Additional Information SS# [id number]  Judi Cong, LCSW

## 2016-12-31 NOTE — Anesthesia Preprocedure Evaluation (Addendum)
Anesthesia Evaluation  Patient identified by MRN, date of birth, ID band Patient awake    Reviewed: Allergy & Precautions, NPO status , Patient's Chart, lab work & pertinent test results  Airway Mallampati: II  TM Distance: >3 FB     Dental  (+) Poor Dentition, Chipped   Pulmonary neg pulmonary ROS,    Pulmonary exam normal        Cardiovascular hypertension, Normal cardiovascular exam     Neuro/Psych MS Hx negative psych ROS   GI/Hepatic negative GI ROS, Neg liver ROS,   Endo/Other  Hypothyroidism   Renal/GU negative Renal ROS  negative genitourinary   Musculoskeletal   Abdominal Normal abdominal exam  (+)   Peds negative pediatric ROS (+)  Hematology   Anesthesia Other Findings Past Medical History: No date: Hypertension No date: Hypothyroidism No date: Multiple sclerosis (HCC)  Reproductive/Obstetrics                            Anesthesia Physical Anesthesia Plan  ASA: III  Anesthesia Plan: General   Post-op Pain Management:    Induction: Intravenous  PONV Risk Score and Plan:   Airway Management Planned: LMA  Additional Equipment:   Intra-op Plan:   Post-operative Plan: Extubation in OR  Informed Consent: I have reviewed the patients History and Physical, chart, labs and discussed the procedure including the risks, benefits and alternatives for the proposed anesthesia with the patient or authorized representative who has indicated his/her understanding and acceptance.   Dental advisory given  Plan Discussed with: CRNA and Surgeon  Anesthesia Plan Comments:      Anesthesia Quick Evaluation

## 2016-12-31 NOTE — Clinical Social Work Note (Signed)
CSW received consult for SNF placement. CSW will follow pending PT recommendations.  Pietro Bonura Martha Notnamed Croucher, MSW, LCSWA [phone] 

## 2016-12-31 NOTE — Progress Notes (Addendum)
[facility] [place] [facility]   PATIENT NAME: Caitlin Colon    MR#:  [id number]  DATE OF BIRTH:  [birth year]  SUBJECTIVE:   Came in after a mechanical fall   Patient's son and daughter in law in the room  REVIEW OF SYSTEMS:   Review of Systems  Constitutional: Negative for chills, fever and weight loss.  HENT: Negative for ear discharge, ear pain and nosebleeds.   Eyes: Negative for blurred vision, pain and discharge.  Respiratory: Negative for sputum production, shortness of breath, wheezing and stridor.   Cardiovascular: Negative for chest pain, palpitations, orthopnea and PND.  Gastrointestinal: Negative for abdominal pain, diarrhea, nausea and vomiting.  Genitourinary: Negative for frequency and urgency.  Musculoskeletal: Positive for joint pain. Negative for back pain.  Neurological: Negative for sensory change, speech change, focal weakness and weakness.  Psychiatric/Behavioral: Negative for depression and hallucinations. The patient is not nervous/anxious.    Tolerating Diet:Nothing by mouth for surgery  Tolerating PT: Pending  DRUG ALLERGIES:  No Known Allergies  VITALS:  Blood pressure (!) 144/52, pulse (!) 54, temperature 99 F (37.2 C), temperature source Oral, resp. rate 16, height 5' (1.524 m), weight 50.6 kg (111 lb 9.6 oz), SpO2 93 %.  PHYSICAL EXAMINATION:   Physical Exam  GENERAL:  83 y.o.-year-old patient lying in the bed with no acute distress.  EYES: Pupils equal, round, reactive to light and accommodation. No scleral icterus. Extraocular muscles intact.  HEENT: Head atraumatic, normocephalic. Oropharynx and nasopharynx clear.  NECK:  Supple, no jugular venous distention. No thyroid enlargement, no tenderness.  LUNGS: Normal breath sounds bilaterally, no wheezing, rales, rhonchi. No use of accessory muscles of respiration.  CARDIOVASCULAR: S1, S2 normal. No murmurs, rubs, or gallops.  ABDOMEN: Soft, nontender, nondistended.  Bowel sounds present. No organomegaly or mass.  EXTREMITIES: No cyanosis, clubbing or edema b/l. Left lower extremity [place] traction   NEUROLOGIC: Cranial nerves II through XII are intact. No focal Motor or sensory deficits b/l.   PSYCHIATRIC:  patient is alert and oriented x 3.  SKIN: No obvious rash, lesion, or ulcer.   LABORATORY PANEL:  CBC  Recent Labs Lab 12/31/16 0418  WBC 7.1  HGB 10.6*  HCT 30.4*  PLT 190    Chemistries   Recent Labs Lab 12/31/16 0418  NA 137  K 4.0  CL 108  CO2 27  GLUCOSE 113*  BUN 24*  CREATININE 0.80  CALCIUM 8.5*  AST 19  ALT 11*  ALKPHOS 46  BILITOT 0.7   Cardiac Enzymes No results for input(s): TROPONINI in the last 168 hours. RADIOLOGY:  Dg Chest 1 View  Result Date: 12/30/2016 CLINICAL DATA:  Pain following fall EXAM: CHEST 1 VIEW COMPARISON:  None. FINDINGS: There is no edema or consolidation. Heart is mildly enlarged with pulmonary vascularity within normal limits. There is aortic atherosclerosis. No adenopathy. No pneumothorax. No evident bone lesions. IMPRESSION: Mild cardiomegaly. Aortic atherosclerosis. No edema or consolidation. Aortic Atherosclerosis (ICD10-I70.0). Electronically Signed   By: Bretta Bang III M.D.   On: 12/30/2016 16:42   Dg Hip Unilat W Or Wo Pelvis 2-3 Views Left  Result Date: 12/30/2016 CLINICAL DATA:  Fall.  Left-sided hip pain. EXAM: DG HIP (WITH OR WITHOUT PELVIS) 2-3V LEFT COMPARISON:  None. FINDINGS: Frontal pelvis with AP and frog-leg lateral views of the left hip show a comminuted left intertrochanteric femoral neck fracture. Varus angulation associated. Bones are diffusely demineralized. IMPRESSION: Comminuted left intertrochanteric femoral neck fracture with varus  angulation. Electronically Signed   By: Kennith Center M.D.   On: 12/30/2016 16:45   ASSESSMENT AND PLAN:   Caitlin Colon is a 83 y.o. female has a past medical history significant for MS, HTN and thyroid disease now with mechanical fall  and left hip pain. Brought to ER where plain films reveal left hip fx.  *comminuted left intertrochanteric hip fracture -When necessary pain meds -IV fluids -Patient to get left hip nailing today per Dr. Hyacinth Meeker  *Hypertension we'll resume home meds after surgery  *History of multiple sclerosis  *DVT prophylaxis per Dr. Hyacinth Meeker  Case discussed with Care Management/Social Worker. Management plans discussed with the patient, family and they are in agreement.  CODE STATUS: Full code  DVT Prophylaxis: Per orthopedic  TOTAL TIME TAKING CARE OF THIS PATIENT: 25 minutes.  >50% time spent on counselling and coordination of care  POSSIBLE D/C IN 2-3 DAYS, DEPENDING ON CLINICAL CONDITION.  Note: This dictation was prepared with Dragon dictation along with smaller phrase technology. Any transcriptional errors that result from this process are unintentional.  Abigial Newville M.D on 12/31/2016 at 11:01 AM  Between 7am to 6pm - Pager - [phone]  After 6pm go to [email or web address] - password [email or web address]  [facility]  Office  [phone]  CC: Primary care physician; Dortha Kern, MD

## 2016-12-31 NOTE — H&P (Signed)
THE PATIENT WAS SEEN PRIOR TO SURGERY TODAY.  HISTORY, ALLERGIES, HOME MEDICATIONS AND OPERATIVE PROCEDURE WERE REVIEWED. RISKS AND BENEFITS OF SURGERY DISCUSSED WITH PATIENT AGAIN.  NO CHANGES FROM INITIAL HISTORY AND PHYSICAL NOTED.    

## 2016-12-31 NOTE — Anesthesia Post-op Follow-up Note (Cosign Needed)
Anesthesia QCDR form completed.        

## 2016-12-31 NOTE — H&P (Signed)
ORTHOPAEDIC CONSULTATION  REQUESTING PHYSICIAN: Fritzi Mandes, MD  Chief Complaint: left hip pain  HPI: Caitlin Colon is a 83 y.o. female who complains of  Left hip pain following a fall at home last night.  Exam and x-rays in ER show a comminuted left intertrochanteric hip fracture. Have discussed treatment with patient and family including surgical and non surgical.  They agree to surgical fixation of the fracture to decrease pain and facilitate care.  Risks and benefits and post op recovery discussed in depth.  She has been cleared by medical service for surgery. Plan left hip nailing today.  Past Medical History:  Diagnosis Date  . Hypertension   . Hypothyroidism   . Multiple sclerosis ([place])    Past Surgical History:  Procedure Laterality Date  . VITRECTOMY     Social History   Social History  . Marital status: Married    Spouse name: N/A  . Number of children: N/A  . Years of education: N/A   Social History Main Topics  . Smoking status: Never Smoker  . Smokeless tobacco: Never Used  . Alcohol use No  . Drug use: No  . Sexual activity: Not Asked   Other Topics Concern  . None   Social History Narrative  . None   History reviewed. No pertinent family history. No Known Allergies Prior to Admission medications   Medication Sig Start Date End Date Taking? Authorizing Provider  acetaminophen (TYLENOL) 500 MG tablet Take 500 mg by mouth every 6 (six) hours as needed.   Yes [provider]  atenolol (TENORMIN) 50 MG tablet Take 50 mg by mouth daily.   Yes [provider]  cholecalciferol (VITAMIN D) 1000 units tablet Take 1,000 Units by mouth daily.   Yes [provider]  Cyanocobalamin 1000 MCG/ML KIT Inject 1 mL as directed every 30 (thirty) days.   Yes [provider]  levothyroxine (SYNTHROID, LEVOTHROID) 50 MCG tablet Take 50 mcg by mouth daily before breakfast.   Yes [provider]  ondansetron (ZOFRAN) 4 MG tablet Take  1 tablet (4 mg total) by mouth every 6 (six) hours as needed for nausea. Patient not taking: Reported on 12/30/2016 12/25/15   Vaughan Basta, MD   Dg Chest 1 View  Result Date: 12/30/2016 CLINICAL DATA:  Pain following fall EXAM: CHEST 1 VIEW COMPARISON:  None. FINDINGS: There is no edema or consolidation. Heart is mildly enlarged with pulmonary vascularity within normal limits. There is aortic atherosclerosis. No adenopathy. No pneumothorax. No evident bone lesions. IMPRESSION: Mild cardiomegaly. Aortic atherosclerosis. No edema or consolidation. Aortic Atherosclerosis (ICD10-I70.0). Electronically Signed   By: Lowella Grip III M.D.   On: 12/30/2016 16:42   Dg Hip Unilat W Or Wo Pelvis 2-3 Views Left  Result Date: 12/30/2016 CLINICAL DATA:  Fall.  Left-sided hip pain. EXAM: DG HIP (WITH OR WITHOUT PELVIS) 2-3V LEFT COMPARISON:  None. FINDINGS: Frontal pelvis with AP and frog-leg lateral views of the left hip show a comminuted left intertrochanteric femoral neck fracture. Varus angulation associated. Bones are diffusely demineralized. IMPRESSION: Comminuted left intertrochanteric femoral neck fracture with varus angulation. Electronically Signed   By: Misty Stanley M.D.   On: 12/30/2016 16:45    Positive ROS: All other systems have been reviewed and were otherwise negative with the exception of those mentioned in the HPI and as above.  Physical Exam: General: Alert, no acute distress Cardiovascular: No pedal edema Respiratory: No cyanosis, no use of accessory musculature GI: No organomegaly, abdomen is  soft and non-tender Skin: No lesions in the area of chief complaint Neurologic: Sensation intact distally Psychiatric: Patient is competent for consent with normal mood and affect Lymphatic: No axillary or cervical lymphadenopathy  MUSCULOSKELETAL: Alert with minimal confusion.  Left leg short and rotated.  csm good distally. Pain with rom of left hip.  No other injuries noted.  Skin  intact.    Assessment: Comminuted left intertrochanteric hip fracture  Plan: Left hip nailing.    Park Breed, MD [phone]   12/31/2016 9:56 AM

## 2016-12-31 NOTE — Anesthesia Procedure Notes (Signed)
Procedure Name: LMA Insertion Date/Time: 12/31/2016 11:00 AM Performed by: Mathews Argyle Pre-anesthesia Checklist: Patient identified, Patient being monitored, Timeout performed, Emergency Drugs available and Suction available Patient Re-evaluated:Patient Re-evaluated prior to inductionOxygen Delivery Method: Circle system utilized Preoxygenation: Pre-oxygenation with 100% oxygen Intubation Type: IV induction Ventilation: Mask ventilation without difficulty LMA: LMA inserted LMA Size: 3.5 Tube type: Oral Number of attempts: 1 Placement Confirmation: positive ETCO2 and breath sounds checked- equal and bilateral Tube secured with: Tape Dental Injury: Teeth and Oropharynx as per pre-operative assessment

## 2016-12-31 NOTE — Anesthesia Postprocedure Evaluation (Signed)
Anesthesia Post Note  Patient: Caitlin Colon  Procedure(s) Performed: Procedure(s) (LRB): INTRAMEDULLARY (IM) NAIL INTERTROCHANTRIC (Left)  Patient location during evaluation: PACU Anesthesia Type: General Level of consciousness: awake and alert and oriented Pain management: pain level controlled Vital Signs Assessment: post-procedure vital signs reviewed and stable Respiratory status: spontaneous breathing Cardiovascular status: blood pressure returned to baseline Anesthetic complications: no     Last Vitals:  Vitals:   12/31/16 1902 12/31/16 1942  BP: (!) 122/47 (!) 96/47  Pulse: (!) 58 (!) 56  Resp: 18 19  Temp: 36.7 C 36.8 C    Last Pain:  Vitals:   12/31/16 1942  TempSrc: Oral  PainSc:                  [name]

## 2016-12-31 NOTE — Op Note (Signed)
DATE OF SURGERY:  12/31/2016  TIME: 12:06 PM  PATIENT NAME:  Caitlin Colon  AGE: 83 y.o.  PRE-OPERATIVE DIAGNOSIS:  left hip fracture intertrochanteric  POST-OPERATIVE DIAGNOSIS:  SAME  PROCEDURE:  INTRAMEDULLARY (IM) NAIL INTERTROCHANTRIC LEFT HIP  SURGEON:  Jahaad Penado E  EBL:  25 cc  COMPLICATIONS:  NONE  OPERATIVE IMPLANTS: Synthes trochanteric femoral nail  130*/12MM  with interlocking helical blade  85 mm and distal locking screw  38 mm.  PREOPERATIVE INDICATIONS:  Caitlin Colon is a 83 y.o. year old who fell and suffered a hip fracture. She was brought into the ER and then admitted and optimized and then elected for surgical intervention.    The risks benefits and alternatives were discussed with the patient including but not limited to the risks of nonoperative treatment, versus surgical intervention including infection, bleeding, nerve injury, malunion, nonunion, hardware prominence, hardware failure, need for hardware removal, blood clots, cardiopulmonary complications, morbidity, mortality, among others, and they were willing to proceed.    OPERATIVE PROCEDURE:  The patient was brought to the operating room and placed in the supine position.  GENERAL LMA anesthesia was administered, with a foley. She was placed on the fracture table.  Closed reduction was performed under C-arm guidance. The length of the femur was also measured using fluoroscopy. Time out was then performed after sterile prep and drape. She received preoperative antibiotics.  Incision was made proximal to the greater trochanter. A guidewire was placed in the appropriate position. Confirmation was made on AP and lateral views. The above-named nail was opened. I opened the proximal femur with a reamer. I then placed the nail by hand easily down. I did not need to ream the femur.  Once the nail was completely seated, I placed a guidepin into the femoral head into the center center position through a second  incision.  I measured the length, and then reamed the lateral cortex and up into the head. I then placed the helical blade. Slight compression was applied. Anatomic fixation achieved. Bone quality was mediocre.  I then secured the proximal interlock.  The distal locking screw was then placed and after confirming the position of the fracture fragments and hardware I then removed the instruments, and took final C-arm pictures AP and lateral the entire length of the leg. Anatomic reconstruction was achieved, and the wounds were irrigated copiously and closed with Vicryl  followed by staples and dry sterile dressing. Sponge and needle count were correct.   The patient was awakened and returned to PACU in stable and satisfactory condition. There no complications and the patient tolerated the procedure well.  She will be partial weightbearing as tolerated, and will be on Lovenox  For DVT prophylaxis.     Valinda Hoar, M.D.

## 2016-12-31 NOTE — Transfer of Care (Signed)
Immediate Anesthesia Transfer of Care Note  Patient: Caitlin Colon  Procedure(s) Performed: Procedure(s): INTRAMEDULLARY (IM) NAIL INTERTROCHANTRIC (Left)  Patient Location: PACU  Anesthesia Type:General  Level of Consciousness: sedated  Airway & Oxygen Therapy: Patient Spontanous Breathing and Patient connected to face mask oxygen  Post-op Assessment: Report given to RN and Post -op Vital signs reviewed and stable  Post vital signs: Reviewed  Last Vitals:  Vitals:   12/31/16 0728 12/31/16 1212  BP: (!) 144/52 (!) 141/64  Pulse: (!) 54 (!) 52  Resp: 16 12  Temp: 37.2 C (!) 36.3 C    Last Pain:  Vitals:   12/31/16 0911  TempSrc:   PainSc: 7          Complications: No apparent anesthesia complications

## 2017-01-01 LAB — BASIC METABOLIC PANEL
ANION GAP: 6 (ref 5–15)
BUN: 19 mg/dL (ref 6–20)
CALCIUM: 7.9 mg/dL — AB (ref 8.9–10.3)
CO2: 26 mmol/L (ref 22–32)
Chloride: 101 mmol/L (ref 101–111)
Creatinine, Ser: 0.98 mg/dL (ref 0.44–1.00)
GFR, EST AFRICAN AMERICAN: 55 mL/min — AB (ref >60)
GFR, EST NON AFRICAN AMERICAN: 48 mL/min — AB (ref >60)
GLUCOSE: 120 mg/dL — AB (ref 65–99)
POTASSIUM: 4.1 mmol/L (ref 3.5–5.1)
Sodium: 133 mmol/L — ABNORMAL LOW (ref 135–145)

## 2017-01-01 LAB — CBC
HEMATOCRIT: 25.9 % — AB (ref 35.0–47.0)
Hemoglobin: 9.1 g/dL — ABNORMAL LOW (ref 12.0–16.0)
MCH: 35.3 pg — ABNORMAL HIGH (ref 26.0–34.0)
MCHC: 35.2 g/dL (ref 32.0–36.0)
MCV: 100.2 fL — AB (ref 80.0–100.0)
Platelets: 166 10*3/uL (ref 150–440)
RBC: 2.59 MIL/uL — AB (ref 3.80–5.20)
RDW: 13.2 % (ref 11.5–14.5)
WBC: 7.3 10*3/uL (ref 3.6–11.0)

## 2017-01-01 MED ORDER — ATENOLOL 25 MG PO TABS
50.0000 mg | ORAL_TABLET | Freq: Every day | ORAL | Status: DC
Start: 2017-01-01 — End: 2017-01-02
  Administered 2017-01-01 – 2017-01-02 (×2): 50 mg via ORAL
  Filled 2017-01-01 (×2): qty 2

## 2017-01-01 MED ORDER — LEVOTHYROXINE SODIUM 50 MCG PO TABS
50.0000 ug | ORAL_TABLET | Freq: Every day | ORAL | Status: DC
  Administered 2017-01-02: 50 ug via ORAL
  Filled 2017-01-01: qty 1

## 2017-01-01 MED ORDER — VITAMIN D 1000 UNITS PO TABS
1000.0000 [IU] | ORAL_TABLET | Freq: Every day | ORAL | Status: DC
  Administered 2017-01-01 – 2017-01-02 (×2): 1000 [IU] via ORAL
  Filled 2017-01-01 (×2): qty 1

## 2017-01-01 NOTE — Progress Notes (Signed)
Pt dangled on side of the bed for a few minutes.

## 2017-01-01 NOTE — Evaluation (Signed)
Physical Therapy Evaluation Patient Details Name: Caitlin Colon MRN: [id number] DOB: [birth year] Today's Date: 01/01/2017   History of Present Illness  Pt s/p fall (12/30/2016) and s/p IMN L hip (12/31/2016). PMH: MS with history of L sided weakness, HTN, Thyroid disease.  Clinical Impression  Prior to admission pt reports ambulating small distances with 4-wheel RW and WC use for community mobility. Per pt's son, she has not been herself cognitively since anesthesia. Pt currently requires mod assist from 2 therapists for bed mobility, mod assist from 2 therapist for sit-to-stands and max assist from one therapist for stand pivot transfer. Pt denies pain at beginning and end of session with significant increase in pain with mobility and transfers (pt given pain meds prior to session). Pt would benefit from continued skilled PT to address functional mobility, transfers, and strengthening deficits for L LE (see details below).    Follow Up Recommendations SNF    Equipment Recommendations  Rolling walker with 5" wheels    Recommendations for Other Services       Precautions / Restrictions Precautions Precautions: Fall Restrictions Weight Bearing Restrictions: Yes LLE Weight Bearing: Partial weight bearing LLE Partial Weight Bearing Percentage or Pounds:  (<50%)      Mobility  Bed Mobility Overal bed mobility: Needs Assistance Bed Mobility: Supine to Sit     Supine to sit: +2 for physical assistance     General bed mobility comments: one therapist with mod assist to trunk and one with mod assist to B LE  Transfers Overall transfer level: Needs assistance   Transfers: Sit to/from Stand;Stand Pivot Transfers Sit to Stand: +2 physical assistance;Mod assist Stand pivot transfers: Max assist       General transfer comment: Pt able to perform sit-to-stand to RW with mod assist from 2 therapists. Pt cued for PWB precautions but was hesitant to place L LE on the ground. Pt unable to stand  upright due to complaints of significant L hip pain.  Pt was cued for pivoting with R LE but was unable to perform with 2 assist. Stand pivot transfer to the R was performed to get the pt from EOB to chair with verbal cues for positioning, hand placement, and LE placement.  Ambulation/Gait             General Gait Details: deferred due to not appropriate at this time  Stairs            Wheelchair Mobility    Modified Rankin (Stroke Patients Only)       Balance Overall balance assessment: Needs assistance;History of Falls Sitting-balance support: Bilateral upper extremity supported Sitting balance-Leahy Scale: Fair Sitting balance - Comments: pt had minor unsteadiness but was able to self-correct Postural control: Right lateral lean (R lateral lean noted but anticipated due to surgical intervention on L LE) Standing balance support: Bilateral upper extremity supported Standing balance-Leahy Scale: Fair Standing balance comment: with RW support                             Pertinent Vitals/Pain Pain Assessment: Faces Faces Pain Scale: No hurt (no complaints of pain at beginning and end of session but appeared 8/10 with mobility ) Pain Location: L hip/thigh Pain Descriptors / Indicators: Sharp;Spasm;Grimacing;Guarding;Moaning;Operative site guarding Pain Intervention(s): Limited activity within patient's tolerance;Monitored during session;Premedicated before session;Repositioned  HR 60 bpm prior to transfer, 66 bpm after stand pivot transfer (taken manually because pulse ox unable to read)  Home Living Family/patient expects to be discharged to:: Skilled nursing facility Living Arrangements: Alone Available Help at Discharge: Family;Other (Comment) (facility staff checks in for needs about 1 hr per day) Type of Home: Independent living facility Home Access: Elevator     Home Layout: One level Home Equipment: Walker - 4 wheels;Shower seat - built in;Grab  bars - toilet;Grab bars - tub/shower;Wheelchair - manual      Prior Function Level of Independence: Independent with assistive device(s)         Comments: RW for home activities (pt independent with making own meals), WC for community mobility, pt's son reports no recent MS "flare ups"     Hand Dominance        Extremity/Trunk Assessment   Upper Extremity Assessment Upper Extremity Assessment: Defer to OT evaluation    Lower Extremity Assessment Lower Extremity Assessment: LLE deficits/detail LLE Deficits / Details: L anke DF/PF at least 3/5, L knee flexion at least 3/5, L knee extension at least 2/5, L hip flexion at least 2+/5, L hip abduction and adduction (to neutral) at least 2/5    Cervical / Trunk Assessment Cervical / Trunk Assessment: Kyphotic  Communication   Communication: HOH (Hearing aid, easier hearing out of R ear)  Cognition Arousal/Alertness: Awake/alert Behavior During Therapy: WFL for tasks assessed/performed Overall Cognitive Status:  (oriented to name, DOB, and son but confusion noted with social history)                                 General Comments: Pt able to follow simple, one-step commands consistently, but had difficulty following multi-step/complex commands. Communication complicated due to [facility].      General Comments      Exercises Total Joint Exercises Ankle Circles/Pumps: AROM;Strengthening;Left;10 reps;Supine (HOB elevated) Quad Sets: AROM;Strengthening;Left;10 reps;Supine (HOB elevated) Short Arc Quad: AAROM;Strengthening;Left;10 reps;Supine (HOB elevated) Heel Slides: AAROM;Strengthening;Left;10 reps;Supine (HOB elevated) Hip ABduction/ADduction: AAROM;Strengthening;Left;10 reps;Supine (HOB elevated, adduction to neutral)   Assessment/Plan    PT Assessment Patient needs continued PT services  PT Problem List Decreased strength;Decreased range of motion;Decreased balance;Decreased mobility;Decreased  cognition;Decreased knowledge of use of DME;Decreased knowledge of precautions;Pain;Decreased activity tolerance       PT Treatment Interventions DME instruction;Gait training;Functional mobility training;Therapeutic activities;Therapeutic exercise;Balance training;Patient/family education    PT Goals (Current goals can be found in the Care Plan section)  Acute Rehab PT Goals Patient Stated Goal: pt would like to return to modified independent ambulation with RW PT Goal Formulation: With patient/family Time For Goal Achievement: 01/15/17 Potential to Achieve Goals: Fair    Frequency BID   Barriers to discharge Decreased caregiver support      Co-evaluation               AM-PAC PT "6 Clicks" Daily Activity  Outcome Measure Difficulty turning over in bed (including adjusting bedclothes, sheets and blankets)?: Total Difficulty moving from lying on back to sitting on the side of the bed? : Total Difficulty sitting down on and standing up from a chair with arms (e.g., wheelchair, bedside commode, etc,.)?: Total Help needed moving to and from a bed to chair (including a wheelchair)?: A Lot Help needed walking in hospital room?: Total Help needed climbing 3-5 steps with a railing? : Total 6 Click Score: 7    End of Session Equipment Utilized During Treatment: Gait belt Activity Tolerance: Patient limited by pain Patient left: in chair;with call bell/phone within reach;with chair alarm set;with  family/visitor present;with SCD's reapplied (B heels elevated via pillow) Nurse Communication: Mobility status;Precautions;Weight bearing status (via whiteboard) PT Visit Diagnosis: Unsteadiness on feet (R26.81);History of falling (Z91.81);Other abnormalities of gait and mobility (R26.89);Muscle weakness (generalized) (M62.81);Pain Pain - Right/Left: Left Pain - part of body: Hip    Time: 1610-9604 PT Time Calculation (min) (ACUTE ONLY): 50 min   Charges:         PT G CodesGwenlyn Found, SPT 01/01/17,1:02 PM [phone]

## 2017-01-01 NOTE — Care Management Note (Signed)
Case Management Note  Patient Details  Name: Jaelen Gellerman MRN: [id number] Date of Birth: [birth year]  Subjective/Objective:  Admitted with fall and left hip fracture. Left nailing 07/08 by Dr. Hyacinth Meeker. Presents from [facility]. She uses a rolling walker and a wheelchair when she travels outside of the facility. Son assists with transportation.  PT pending.                  Action/Plan: Will follow progression and assist as needed.   Expected Discharge Date:  01/01/17               Expected Discharge Plan:     In-House Referral:     Discharge planning Services  CM Consult  Post Acute Care Choice:    Choice offered to:     DME Arranged:    DME Agency:     HH Arranged:    HH Agency:     Status of Service:  In process, will continue to follow  If discussed at Long Length of Stay Meetings, dates discussed:    Additional Comments:  Marily Memos, RN 01/01/2017, 8:56 AM

## 2017-01-01 NOTE — Clinical Social Work Placement (Signed)
   CLINICAL SOCIAL WORK PLACEMENT  NOTE  Date:  01/01/2017  Patient Details  Name: Caitlin Colon MRN: [id number] Date of Birth: 04-24-1920  Clinical Social Work is seeking post-discharge placement for this patient at the Skilled  Nursing Facility level of care (*CSW will initial, date and re-position this form in  chart as items are completed):  Yes   Patient/family provided with [facility] Clinical Social Work Department's list of facilities offering this level of care within the geographic area requested by the patient (or if unable, by the patient's family).  Yes   Patient/family informed of their freedom to choose among providers that offer the needed level of care, that participate in Medicare, Medicaid or managed care program needed by the patient, have an available bed and are willing to accept the patient.  Yes   Patient/family informed of [facility]'s ownership interest in [facility] and [facility], as well as of the fact that they are under no obligation to receive care at these facilities.  PASRR submitted to EDS on 12/31/16     PASRR number received on 12/31/16     Existing PASRR number confirmed on       FL2 transmitted to all facilities in geographic area requested by pt/family on 12/31/16     FL2 transmitted to all facilities within larger geographic area on       Patient informed that his/her managed care company has contracts with or will negotiate with certain facilities, including the following:        Yes   Patient/family informed of bed offers received.  Patient chooses bed at  [facility] )     Physician recommends and patient chooses bed at      Patient to be transferred to   on  .  Patient to be transferred to facility by       Patient family notified on   of transfer.  Name of family member notified:        PHYSICIAN       Additional Comment:    _______________________________________________ Kyler Germer, Darleen Crocker, LCSW 01/01/2017,  2:09 PM

## 2017-01-01 NOTE — Progress Notes (Signed)
Subjective: 1 Day Post-Op Procedure(s) (LRB): INTRAMEDULLARY (IM) NAIL INTERTROCHANTRIC (Left)    Patient reports pain as mild.  Objective: Hip stable.  csm good and hgb 9.1  Rom good  VITALS:   Vitals:   01/01/17 0535 01/01/17 0751  BP: (!) 113/50 129/90  Pulse: 60 71  Resp: 19 18  Temp: 98.5 F (36.9 C) 98.4 F (36.9 C)    Neurologically intact ABD soft Neurovascular intact Sensation intact distally Intact pulses distally Dorsiflexion/Plantar flexion intact Incision: scant drainage  LABS  Recent Labs  12/30/16 1556 12/31/16 0418 01/01/17 0504  HGB 12.5 10.6* 9.1*  HCT 36.7 30.4* 25.9*  WBC 6.0 7.1 7.3  PLT 245 190 166     Recent Labs  12/30/16 1556 12/31/16 0418 01/01/17 0504  NA 137 137 133*  K 4.0 4.0 4.1  BUN 28* 24* 19  CREATININE 1.15* 0.80 0.98  GLUCOSE 101* 113* 120*     Recent Labs  12/30/16 1556  INR 1.02     Assessment/Plan: 1 Day Post-Op Procedure(s) (LRB): INTRAMEDULLARY (IM) NAIL INTERTROCHANTRIC (Left)   Advance diet Up with therapy D/C IV fluids Discharge to SNF

## 2017-01-01 NOTE — Progress Notes (Signed)
Physical Therapy Treatment Patient Details Name: Caitlin Colon MRN: [id number] DOB: 1920-01-22 Today's Date: 01/01/2017    History of Present Illness Pt s/p fall (12/30/2016) and s/p IMN L hip (12/31/2016). PMH: MS with history of L sided weakness, HTN, Thyroid disease.    PT Comments    Pt currently requires mod assist from 2 therapists for bed mobility, mod assist from 2 therapist for sit-to-stands and mod assist from 2 therapists for stand pivot transfer. Pt denies pain at beginning and end of session with significant increase in pain with mobility and transfers. Pt progressing towards goals as her activity tolerance increases. Pt would benefit from continued skilled PT to address functional mobility, transfers, and strengthening deficits for L LE.   Follow Up Recommendations  SNF     Equipment Recommendations  Rolling walker with 5" wheels    Recommendations for Other Services       Precautions / Restrictions Precautions Precautions: Fall Restrictions Weight Bearing Restrictions: Yes LLE Weight Bearing: Partial weight bearing LLE Partial Weight Bearing Percentage or Pounds: <50%    Mobility  Bed Mobility Overal bed mobility: Needs Assistance Bed Mobility: Sit to Supine     Sit to supine: Mod assist;+2 for physical assistance   General bed mobility comments: pt required mod assist from 2 PTs (mod assist for trunk and mod assist for LE) with cueing for proper form/positioning  Transfers Overall transfer level: Needs assistance Equipment used: Rolling walker (2 wheeled) Transfers: Sit to/from [employer] Sit to Stand: +2 physical assistance;Mod assist Stand pivot transfers: +2 physical assistance;Mod assist       General transfer comment: attempted transfer from chair to bed with 5 trials in attempt for increased pt functional ability. Trial 1: pt performed sit-to-stand with mod assist from 2 PTs in attempt to do small hops on her R LE with the RW with  verbal cueing and demonstration (pt unable to perform hop and assisted back to sitting in chair). Trial 2: pt performed sit-to-stand with mod assist from 2 PTs in attempt to pivot on her R LE to the left with the RW with verbal and tactile cueing for foot positioning and proper form (pt unable to perform pivot and assisted back to sitting in chair). Trial 3: pt performed sit-to-stand with mod assist from 2 PTs in attempt to pivot on her R LE to the right with the RW with verbal cueing for foot placement under her for more optimal positioning (pt unable to perform pivot and assisted back to sitting in chair). Trial 4: pt performed sit-to-stand with mod assist from 2 PTs in attempt to pivot on her R LE to the right with the RW with verbal and tactile cueing for foot placement and positioning to optimize her ability for transfer (pt unable to perform pivot and assited back to sitting in chair). Final trial: Stand pivot transfer performed with 2 PTs due to fatigue for safety.  Ambulation/Gait             General Gait Details: deferred due to not appropriate at this time   Stairs            Wheelchair Mobility    Modified Rankin (Stroke Patients Only)       Balance Overall balance assessment: Needs assistance;History of Falls Sitting-balance support: Bilateral upper extremity supported Sitting balance-Leahy Scale: Fair Sitting balance - Comments: pt had minor unsteadiness but was able to self-correct Postural control: Right lateral lean (R lateral lean noted but anticipated due to  surgical procedure on L LE) Standing balance support: Bilateral upper extremity supported Standing balance-Leahy Scale: Fair Standing balance comment: with RW support                            Cognition Arousal/Alertness: Awake/alert Behavior During Therapy: WFL for tasks assessed/performed Overall Cognitive Status: Impaired/Different from baseline                                  General Comments: according to pt's son, pt's cognition is impaired in attention, memory and following commands (he believes it is residual effects of anesthesia because that is when the cognitive impairments began)      Exercises     General Comments        Pertinent Vitals/Pain Pain Assessment: Faces Faces Pain Scale: No hurt (increased to 8/10 with activity but decreased to 0/10 at rest beginning and end of session) Pain Location: L hip/thigh Pain Descriptors / Indicators: Sharp;Spasm;Grimacing;Guarding;Moaning;Operative site guarding Pain Intervention(s): Limited activity within patient's tolerance;Monitored during session;Repositioned HR 60 bpm beginning of session, HR 68 bpm end of session (taken manually due to pulse ox not reading)    Home Living Family/patient expects to be discharged to:: Skilled nursing facility (lives in [place]) Living Arrangements: Alone Available Help at Discharge: Family;Other (Comment) ([place] staff check [place] pt approx 1 hr/day) Type of Home: Independent living facility Home Access: Elevator (3rd floor apt)   Home Layout: One level Home Equipment: Walker - 4 wheels;Shower seat - built in;Grab bars - toilet;Grab bars - tub/shower;Wheelchair - manual      Prior Function Level of Independence: Independent with assistive device(s)      Comments: RW for home activities, indep with ADL including meal prep in the apt, WC for public ambulation, pt's son reports no recent MS "flare ups"; sons assist with transportation   PT Goals (current goals can now be found in the care plan section) Acute Rehab PT Goals Patient Stated Goal: to be able to ambulate short distances modified independent PT Goal Formulation: With patient/family Time For Goal Achievement: 01/15/17 Potential to Achieve Goals: Fair Progress towards PT goals: Progressing toward goals (able to tolerate increased activity)    Frequency    BID      PT Plan Current  plan remains appropriate    Co-evaluation              AM-PAC PT "6 Clicks" Daily Activity  Outcome Measure  Difficulty turning over in bed (including adjusting bedclothes, sheets and blankets)?: Total Difficulty moving from lying on back to sitting on the side of the bed? : Total Difficulty sitting down on and standing up from a chair with arms (e.g., wheelchair, bedside commode, etc,.)?: Total Help needed moving to and from a bed to chair (including a wheelchair)?: A Lot Help needed walking in hospital room?: Total Help needed climbing 3-5 steps with a railing? : Total 6 Click Score: 7    End of Session Equipment Utilized During Treatment: Gait belt Activity Tolerance: Patient limited by pain Patient left: in bed;with call bell/phone within reach;with bed alarm set;with family/visitor present;with SCD's reapplied (B heels elevated with pillow) Nurse Communication: Mobility status;Precautions;Weight bearing status (via whiteboard) PT Visit Diagnosis: Unsteadiness on feet (R26.81);History of falling (Z91.81);Other abnormalities of gait and mobility (R26.89);Muscle weakness (generalized) (M62.81);Pain Pain - Right/Left: Left Pain - part of body: Hip  Time: 9604-5409 PT Time Calculation (min) (ACUTE ONLY): 43 min  Charges:                      G CodesGwenlyn Found, SPT 01-26-17,4:09 PM [phone]

## 2017-01-01 NOTE — Progress Notes (Signed)
[facility] [place] at [facility]   PATIENT NAME: Caitlin Colon    MR#:  [id number]  DATE OF BIRTH:  Feb 11, 1920  SUBJECTIVE:   Came in after a mechanical fall   Patient's son in the room  Working with physical therapy today REVIEW OF SYSTEMS:   Review of Systems  Constitutional: Negative for chills, fever and weight loss.  HENT: Negative for ear discharge, ear pain and nosebleeds.   Eyes: Negative for blurred vision, pain and discharge.  Respiratory: Negative for sputum production, shortness of breath, wheezing and stridor.   Cardiovascular: Negative for chest pain, palpitations, orthopnea and PND.  Gastrointestinal: Negative for abdominal pain, diarrhea, nausea and vomiting.  Genitourinary: Negative for frequency and urgency.  Musculoskeletal: Positive for joint pain. Negative for back pain.  Neurological: Negative for sensory change, speech change, focal weakness and weakness.  Psychiatric/Behavioral: Negative for depression and hallucinations. The patient is not nervous/anxious.    Tolerating Diet:Yes Tolerating PT: Rehabilitation  DRUG ALLERGIES:  No Known Allergies  VITALS:  Blood pressure 129/90, pulse 71, temperature 98.4 F (36.9 C), temperature source Oral, resp. rate 18, height 5' (1.524 m), weight 50.6 kg (111 lb 9.6 oz), SpO2 97 %.  PHYSICAL EXAMINATION:   Physical Exam  GENERAL:  83 y.o.-year-old patient lying in the bed with no acute distress.  EYES: Pupils equal, round, reactive to light and accommodation. No scleral icterus. Extraocular muscles intact.  HEENT: Head atraumatic, normocephalic. Oropharynx and nasopharynx clear.  NECK:  Supple, no jugular venous distention. No thyroid enlargement, no tenderness.  LUNGS: Normal breath sounds bilaterally, no wheezing, rales, rhonchi. No use of accessory muscles of respiration.  CARDIOVASCULAR: S1, S2 normal. No murmurs, rubs, or gallops.  ABDOMEN: Soft, nontender, nondistended. Bowel  sounds present. No organomegaly or mass.  EXTREMITIES: No cyanosis, clubbing or edema b/l. Left lower extremity [place] traction   NEUROLOGIC: Cranial nerves II through XII are intact. No focal Motor or sensory deficits b/l.   PSYCHIATRIC:  patient is alert and oriented x 3.  SKIN: No obvious rash, lesion, or ulcer.   LABORATORY PANEL:  CBC  Recent Labs Lab 01/01/17 0504  WBC 7.3  HGB 9.1*  HCT 25.9*  PLT 166    Chemistries   Recent Labs Lab 12/31/16 0418 01/01/17 0504  NA 137 133*  K 4.0 4.1  CL 108 101  CO2 27 26  GLUCOSE 113* 120*  BUN 24* 19  CREATININE 0.80 0.98  CALCIUM 8.5* 7.9*  AST 19  --   ALT 11*  --   ALKPHOS 46  --   BILITOT 0.7  --    Cardiac Enzymes No results for input(s): TROPONINI in the last 168 hours. RADIOLOGY:  Dg Chest 1 View  Result Date: 12/30/2016 CLINICAL DATA:  Pain following fall EXAM: CHEST 1 VIEW COMPARISON:  None. FINDINGS: There is no edema or consolidation. Heart is mildly enlarged with pulmonary vascularity within normal limits. There is aortic atherosclerosis. No adenopathy. No pneumothorax. No evident bone lesions. IMPRESSION: Mild cardiomegaly. Aortic atherosclerosis. No edema or consolidation. Aortic Atherosclerosis (ICD10-I70.0). Electronically Signed   By: Bretta Bang III M.D.   On: 12/30/2016 16:42   Dg Hip Operative Unilat W Or W/o Pelvis Left  Result Date: 12/31/2016 CLINICAL DATA:  Intraoperative evaluation for left femoral neck fracture. EXAM: OPERATIVE left HIP (WITH PELVIS IF PERFORMED) 3 VIEWS TECHNIQUE: Fluoroscopic spot image(s) were submitted for interpretation post-operatively. COMPARISON:  12/30/2016 FINDINGS: 3 spot fluoro images show placement of a helical  blade across a comminuted intertrochanteric femoral neck fracture. Bony alignment is improved compared to the pre reduction film. No evidence for immediate hardware complication. IMPRESSION: Improved alignment after ORIF for left femoral neck fracture. No  evidence for immediate hardware complications. Electronically Signed   By: Kennith Center M.D.   On: 12/31/2016 12:03   Dg Hip Unilat W Or Wo Pelvis 2-3 Views Left  Result Date: 12/30/2016 CLINICAL DATA:  Fall.  Left-sided hip pain. EXAM: DG HIP (WITH OR WITHOUT PELVIS) 2-3V LEFT COMPARISON:  None. FINDINGS: Frontal pelvis with AP and frog-leg lateral views of the left hip show a comminuted left intertrochanteric femoral neck fracture. Varus angulation associated. Bones are diffusely demineralized. IMPRESSION: Comminuted left intertrochanteric femoral neck fracture with varus angulation. Electronically Signed   By: Kennith Center M.D.   On: 12/30/2016 16:45   ASSESSMENT AND PLAN:   Caitlin Colon is a 83 y.o. female has a past medical history significant for MS, HTN and thyroid disease now with mechanical fall and left hip pain. Brought to ER where plain films reveal left hip fx.  *comminuted left intertrochanteric hip fracture,Postop day 1 -When necessary pain meds -Received IV fluids -Patient status post hip nailing per Dr. Hyacinth Meeker  *Hypertension -Resume BP meds  *History of multiple sclerosis  *DVT prophylaxis Lovenox   Discharge in a.m. to rehabilitation if patient continues to improve Case discussed with Care Management/Social Worker. Management plans discussed with the patient, family and they are in agreement.  CODE STATUS: Full code  DVT Prophylaxis: Lovenox  TOTAL TIME TAKING CARE OF THIS PATIENT: 25 minutes.  >50% time spent on counselling and coordination of care  POSSIBLE D/C IN 1 DAYS, DEPENDING ON CLINICAL CONDITION.  Note: This dictation was prepared with Dragon dictation along with smaller phrase technology. Any transcriptional errors that result from this process are unintentional.  Jari Carollo M.D on 01/01/2017 at 1:02 PM  Between 7am to 6pm - Pager - [phone]  After 6pm go to [email or web address] - password [email or web address]  [facility]  Office   [phone]  CC: Primary care physician; Dortha Kern, MD

## 2017-01-01 NOTE — Evaluation (Signed)
Occupational Therapy Evaluation Patient Details Name: Caitlin Colon MRN: [id number] DOB: 12-27-1919 Today's Date: 01/01/2017    History of Present Illness Pt s/p fall (12/30/2016) and s/p IMN L hip (12/31/2016). PMH: MS with history of L sided weakness, HTN, Thyroid disease.   Clinical Impression   Pt seen for OT evaluation this date. Pt was indep with self care tasks, med mgt, and [place] at [place], requiring assist from sons for transportation for appointments and groceries. Pt endorses 6-8 falls in past 12 months (son seemed surprised at this number stating, "that's twice the number that I knew about") due to turning too quickly or attempting to carry something too heavy. To support bladder mgt, pt utilizes disposable briefs and pads, and endorses the need to go to the bathroom approx every 2 hours overnight. Pt educated in use of BSC beside bed overnight to support toileting needs while decreasing risk of falls, particularly in the short term while healing. Pt educated in toileting schedule during the day. Pt very hard of hearing, has hearing aide in R ear, but still had difficulty understanding some questions, requiring additional verbal cues to help. When encouraged to participate in mobility during session, pt politely but firmly declined additional OT services this date. Pt educated in role of OT, would benefit from additional education to support well informed decision making, as pt appears somewhat confused this afternoon (oriented to situation but not date/place). Pt would benefit from skilled OT services to address noted impairments in strength, ROM, decreased safety awareness/knowledge of precautions/knowledge of AE/DME for self care tasks, and increased need for assist with functional mobility, toileting, bathing, and dressing in order to maximize return to PLOF and minimize future falls risk. Recommend STR following hospitalization.    Follow Up Recommendations  SNF     Equipment Recommendations  3 in 1 bedside commode    Recommendations for Other Services       Precautions / Restrictions Precautions Precautions: Fall Restrictions Weight Bearing Restrictions: Yes LLE Weight Bearing: Partial weight bearing LLE Partial Weight Bearing Percentage or Pounds: <50%      Mobility Bed Mobility     General bed mobility comments: deferred d/t pt up in recliner for session, please refer to PT's evaluation note  Transfers       General transfer comment: deferred d/t pt declining to attempt with OT    Balance Overall balance assessment: Needs assistance;History of Falls Sitting-balance support: Bilateral upper extremity supported Sitting balance-Leahy Scale: Fair                           ADL either performed or assessed with clinical judgement   ADL Overall ADL's : Needs assistance/impaired Eating/Feeding: Sitting;Set up   Grooming: Sitting;Set up   Upper Body Bathing: Sitting;Set up;Supervision/ safety   Lower Body Bathing: Sitting/lateral leans;Minimal assistance;Set up   Upper Body Dressing : Sitting;Supervision/safety;Set up   Lower Body Dressing: Sitting/lateral leans;Moderate assistance     Toilet Transfer Details (indicate cue type and reason): pt declined to attempt            General ADL Comments: pt generally min-mod assist for LB ADL     Vision Baseline Vision/History: Wears glasses Wears Glasses: At all times Patient Visual Report: No change from baseline Vision Assessment?: No apparent visual deficits     Perception     Praxis      Pertinent Vitals/Pain Pain Assessment: No/denies pain Faces Pain Scale:  No hurt (no complaints of pain at beginning and end of session but appeared 8/10 with mobility ) Pain Location: L hip/thigh Pain Descriptors / Indicators: Sharp;Spasm;Grimacing;Guarding;Moaning;Operative site guarding Pain Intervention(s): Limited activity within patient's tolerance;Monitored during  session     Hand Dominance     Extremity/Trunk Assessment Upper Extremity Assessment Upper Extremity Assessment: Generalized weakness (fair+ grip strength, generalized weakness but likely [facility] for age)   Lower Extremity Assessment Lower Extremity Assessment: Defer to PT evaluation;LLE deficits/detail LLE Deficits / Details: L anke DF/PF at least 3/5, L knee flexion at least 3/5, L knee extension at least 2/5, L hip flexion at least 2+/5, L hip abduction and adduction (to neutral) at least 2/5   Cervical / Trunk Assessment Cervical / Trunk Assessment: Kyphotic   Communication Communication Communication: HOH (very HOH, hearing aid in R ear)   Cognition Arousal/Alertness: Awake/alert Behavior During Therapy: WFL for tasks assessed/performed Overall Cognitive Status: Difficult to assess                                 General Comments: pt able to follow simple commands well, verbal cues to redirect during questions (could be due to [facility]), oriented to situation but not date/place without verbal cues; no indication from sons that this was abnormal, but was not able to ask directly   General Comments       Exercises Other Exercises Other Exercises: OT facilitated problem solving and educated pt in products and toileting schedule to support bladder mgt, encouraged use of BSC beside bed overnight, as pt endorses using bathroom about every 2 hours overnight which is "very frustrating"   Shoulder Instructions      Home Living Family/patient expects to be discharged to:: Skilled nursing facility (lives in [place] at [place]) Living Arrangements: Alone Available Help at Discharge: Family;Other (Comment) ([place] check [place] approx 1 hr/day) Type of Home: Independent living facility Home Access: Elevator (3rd floor apt)     Home Layout: One level     Bathroom Shower/Tub: Producer, television/film/video: Standard Bathroom Accessibility: Yes   Home Equipment:  Environmental consultant - 4 wheels;Shower seat - built in;Grab bars - toilet;Grab bars - tub/shower;Wheelchair - manual          Prior Functioning/Environment Level of Independence: Independent with assistive device(s)        Comments: RW for home activities, indep with ADL including meal prep in the apt, WC for public ambulation, pt's son reports no recent MS "flare ups"; sons assist with transportation        OT Problem List: Decreased strength;Decreased safety awareness;Decreased activity tolerance;Impaired balance (sitting and/or standing);Decreased knowledge of use of DME or AE;Decreased knowledge of precautions;Decreased range of motion      OT Treatment/Interventions: Self-care/ADL training;Therapeutic exercise;Therapeutic activities;Energy conservation;DME and/or AE instruction;Patient/family education    OT Goals(Current goals can be found in the care plan section) Acute Rehab OT Goals Patient Stated Goal: feel better and go home OT Goal Formulation: With patient/family Time For Goal Achievement: 01/15/17 Potential to Achieve Goals: Good  OT Frequency: Min 1X/week   Barriers to D/C:            Co-evaluation              AM-PAC PT "6 Clicks" Daily Activity     Outcome Measure Help from another person eating meals?: None Help from another person taking care of personal grooming?: None Help  from another person toileting, which includes using toliet, bedpan, or urinal?: A Lot Help from another person bathing (including washing, rinsing, drying)?: A Little Help from another person to put on and taking off regular upper body clothing?: A Little Help from another person to put on and taking off regular lower body clothing?: A Lot 6 Click Score: 18   End of Session    Activity Tolerance: Patient tolerated treatment well Patient left: in chair;with call bell/phone within reach;with chair alarm set;with family/visitor present;with SCD's reapplied  OT Visit Diagnosis: Other  abnormalities of gait and mobility (R26.89);Repeated falls (R29.6);Muscle weakness (generalized) (M62.81)                Time: 1610-9604 OT Time Calculation (min): 41 min Charges:  OT General Charges $OT Visit: 1 Procedure OT Evaluation $OT Eval Low Complexity: 1 Procedure OT Treatments $Self Care/Home Management : 8-22 mins G-Codes:     Richrd Prime, MPH, MS, OTR/L ascom [phone] 01/01/17, 2:33 PM

## 2017-01-01 NOTE — Clinical Social Work Note (Signed)
Clinical Social Work Assessment  Patient Details  Name: Caitlin Colon MRN: [id number] Date of Birth: 1919-09-09  Date of referral:  01/01/17               Reason for consult:  Facility Placement                Permission sought to share information with:  Chartered certified accountant granted to share information::  Yes, Verbal Permission Granted  Name::      Caitlin Colon::     Relationship::     Contact Information:     Housing/Transportation Living arrangements for the past 2 months:  Charity fundraiser of Information:  Adult Children Patient Interpreter Needed:  None Criminal Activity/Legal Involvement Pertinent to Current Situation/Hospitalization:  No - Comment as needed Significant Relationships:  Adult Children Lives with:  Self Do you feel safe going back to the place where you live?  Yes Need for family participation in patient care:  Yes (Comment)  Care giving concerns:  Patient lives at [facility] independent living.    Social Worker assessment / plan:  Holiday representative (CSW) received SNF consult. PT is recommending SNF. CSW met with patient and her son/ HPOA Caitlin Colon [phone] was at bedside. CSW introduced self and explained role of CSW department. Per son patient lives in an apartment in independent living at [place]. CSW explained SNF process and the [facility] is apart of [facility]. CSW explained that medicare requires a 3 night qualifying inpatient stay in a hospital in order to pay for SNF. Patient was admitted to inpatient on 12/30/16. Son verbalized his understanding and wants patient to go to [facility] for short term rehab. FL2 complete and faxed out.   [facility] admissions coordinator at [facility] stated that patient can come to [facility] for rehab. Son and patient accepted bed offer from [facility]. Plan is for patient to D/C to [facility] tomorrow pending medical clearance.  CSW will continue to follow and assist as needed.   Employment status:  Retired Forensic scientist:  Medicare PT Recommendations:  Caitlin Colon / Referral to community resources:  [place]  Patient/Family's Response to care:  Patient and her son are agreeable to [employer]   Patient/Family's Understanding of and Emotional Response to Diagnosis, Current Treatment, and Prognosis:  Patient and her son were very pleasant and thanked CSW for assistance.   Emotional Assessment Appearance:  Appears stated age Attitude/Demeanor/Rapport:    Affect (typically observed):  Pleasant Orientation:  Oriented to Self, Oriented to Place, Oriented to  Time, Oriented to Situation Alcohol / Substance use:  Not Applicable Psych involvement (Current and /or in the community):  No (Comment)  Discharge Needs  Concerns to be addressed:  Discharge Planning Concerns Readmission within the last 30 days:  No Current discharge risk:  Dependent with Mobility Barriers to Discharge:  Continued Medical Work up   [employer], Caitlin Beets, LCSW 01/01/2017, 2:10 PM

## 2017-01-02 ENCOUNTER — Encounter: Payer: Self-pay | Admitting: Specialist

## 2017-01-02 ENCOUNTER — Encounter
Admission: RE | Admit: 2017-01-02 | Discharge: 2017-01-02 | Disposition: A | Discharge status: Home / Self Care | Payer: Medicare Other | Source: Ambulatory Visit | Attending: Internal Medicine | Admitting: Internal Medicine

## 2017-01-02 LAB — CBC
HCT: 27.2 % — ABNORMAL LOW (ref 35.0–47.0)
HEMOGLOBIN: 9.5 g/dL — AB (ref 12.0–16.0)
MCH: 35 pg — AB (ref 26.0–34.0)
MCHC: 34.8 g/dL (ref 32.0–36.0)
MCV: 100.7 fL — AB (ref 80.0–100.0)
Platelets: 168 10*3/uL (ref 150–440)
RBC: 2.7 MIL/uL — AB (ref 3.80–5.20)
RDW: 12.7 % (ref 11.5–14.5)
WBC: 8.4 10*3/uL (ref 3.6–11.0)

## 2017-01-02 LAB — BASIC METABOLIC PANEL
ANION GAP: 6 (ref 5–15)
BUN: 21 mg/dL — AB (ref 6–20)
CHLORIDE: 99 mmol/L — AB (ref 101–111)
CO2: 27 mmol/L (ref 22–32)
Calcium: 8.1 mg/dL — ABNORMAL LOW (ref 8.9–10.3)
Creatinine, Ser: 0.9 mg/dL (ref 0.44–1.00)
GFR calc Af Amer: >60 mL/min (ref >60)
GFR calc non Af Amer: 53 mL/min — ABNORMAL LOW (ref >60)
Glucose, Bld: 127 mg/dL — ABNORMAL HIGH (ref 65–99)
POTASSIUM: 4.2 mmol/L (ref 3.5–5.1)
Sodium: 132 mmol/L — ABNORMAL LOW (ref 135–145)

## 2017-01-02 MED ORDER — HYDROCODONE-ACETAMINOPHEN 5-325 MG PO TABS: 1.0000 | ORAL_TABLET | Freq: Four times a day (QID) | ORAL | 0 refills | Status: AC | PRN

## 2017-01-02 MED ORDER — FERROUS SULFATE 325 (65 FE) MG PO TABS: 325.0000 mg | ORAL_TABLET | Freq: Every day | ORAL | 3 refills | Status: DC

## 2017-01-02 MED ORDER — ENOXAPARIN SODIUM 30 MG/0.3ML ~~LOC~~ SOLN: 30.0000 mg | SUBCUTANEOUS | 0 refills | Status: DC

## 2017-01-02 NOTE — Care Management Important Message (Signed)
Important Message  Patient Details  Name: Caitlin Colon MRN: [id number] Date of Birth: 1920/04/16   Medicare Important Message Given:  Yes    Marily Memos, RN 01/02/2017, 9:31 AM

## 2017-01-02 NOTE — Clinical Social Work Placement (Signed)
   CLINICAL SOCIAL WORK PLACEMENT  NOTE  Date:  01/02/2017  Patient Details  Name: Caitlin Colon MRN: [id number] Date of Birth: August 23, 1919  Clinical Social Work is seeking post-discharge placement for this patient at the Skilled  Nursing Facility level of care (*CSW will initial, date and re-position this form in  chart as items are completed):  Yes   Patient/family provided with [facility] Clinical Social Work Department's list of facilities offering this level of care within the geographic area requested by the patient (or if unable, by the patient's family).  Yes   Patient/family informed of their freedom to choose among providers that offer the needed level of care, that participate in Medicare, Medicaid or managed care program needed by the patient, have an available bed and are willing to accept the patient.  Yes   Patient/family informed of [facility]'s ownership interest in [facility] and [facility], as well as of the fact that they are under no obligation to receive care at these facilities.  PASRR submitted to EDS on 12/31/16     PASRR number received on 12/31/16     Existing PASRR number confirmed on       FL2 transmitted to all facilities in geographic area requested by pt/family on 12/31/16     FL2 transmitted to all facilities within larger geographic area on       Patient informed that his/her managed care company has contracts with or will negotiate with certain facilities, including the following:        Yes   Patient/family informed of bed offers received.  Patient chooses bed at  [facility] )     Physician recommends and patient chooses bed at      Patient to be transferred to  [facility] ) on 01/02/17.  Patient to be transferred to facility by  [facility] )     Patient family notified on 01/02/17 of transfer.  Name of family member notified:   (Patient's con Renata Caprice is aware of D/C today. )     PHYSICIAN       Additional  Comment:    _______________________________________________ [place] Wherry, Darleen Crocker, LCSW 01/02/2017, 10:05 AM

## 2017-01-02 NOTE — Progress Notes (Signed)
OT Cancellation Note  Patient Details Name: Caitlin Colon MRN: [id number] DOB: February 23, 1920   Cancelled Treatment:    Reason Eval/Treat Not Completed:  (PT reports pt. is admamantly refusing therapy. Will continue to monitor, and intervene when appropriate.)  Olegario Messier, MS, OTR/L 01/02/2017, 10:51 AM

## 2017-01-02 NOTE — Progress Notes (Signed)
Patient is ready for discharge. Report has been called to [facility] and now awaiting for EMS to come and transport.

## 2017-01-02 NOTE — Progress Notes (Signed)
PT Cancellation Note  Patient Details Name: Caitlin Colon MRN: [id number] DOB: 18-Apr-1920   Cancelled Treatment:    Reason Eval/Treat Not Completed: Patient declined, no reason specified.  Pt sleeping upon PT entry and woke easily.  Dining service called and pt ordered lunch.  PT encouraged pt to participate in PT and educated pt on benefits of participation but pt firmly refusing (including bed level ex's) d/t wanting to sleep instead.  Plan for discharge to STR today.  Hendricks Limes, PT 01/02/17, 10:25 AM [phone]

## 2017-01-02 NOTE — Progress Notes (Signed)
Hearing aide in place in patient's right ear. Report given to Maureen Ralphs, Charity fundraiser.

## 2017-01-02 NOTE — Discharge Summary (Signed)
Caitlin Colon: Caitlin Colon    MR#:  [id number]  DATE OF BIRTH:  1920-04-03  DATE OF ADMISSION:  12/30/2016 ADMITTING PHYSICIAN: Idelle Crouch, MD  DATE OF DISCHARGE: 01/02/17  PRIMARY CARE PHYSICIAN: Lynnell Jude, MD    ADMISSION DIAGNOSIS:  Closed fracture of left hip, initial encounter ([place]) [S72.002A]  DISCHARGE DIAGNOSIS:  Closed left hip intertrochanteric fracture  HTN  SECONDARY DIAGNOSIS:   Past Medical History:  Diagnosis Date  . Hypertension   . Hypothyroidism   . Multiple sclerosis [facility])     HOSPITAL COURSE:   [name] a 83 y.o.femalehas a past medical history significant for MS, HTN and thyroid disease now with mechanical fall and left hip pain. Brought to ER where plain films reveal left hip fx.  *comminuted left intertrochanteric hip fracture,Postop day 2 -When necessary pain meds -Received IV fluids -Patient status post hip nailing per Dr. Sabra Heck  *  mild post op anemia due to blood loss from hip surgery -hgb 9.2 stable  *Hypertension -Resume BP meds  *History of multiple sclerosis  *DVT prophylaxis Lovenox   D/c to [facility] today CONSULTS OBTAINED:  Treatment Team:  Fritzi Mandes, MD Earnestine Leys, MD  DRUG ALLERGIES:  No Known Allergies  DISCHARGE MEDICATIONS:   Current Discharge Medication List    START taking these medications   Details  enoxaparin (LOVENOX) 30 MG/0.3ML injection Inject 0.3 mLs (30 mg total) into the skin daily. Qty: 14 Syringe, Refills: 0    ferrous sulfate 325 (65 FE) MG tablet Take 1 tablet (325 mg total) by mouth daily with breakfast. Qty: 30 tablet, Refills: 3    HYDROcodone-acetaminophen (NORCO/VICODIN) 5-325 MG tablet Take 1 tablet by mouth every 6 (six) hours as needed for moderate pain. Qty: 15 tablet, Refills: 0      CONTINUE these medications which have NOT CHANGED   Details  acetaminophen (TYLENOL) 500 MG tablet Take 500  mg by mouth every 6 (six) hours as needed.    atenolol (TENORMIN) 50 MG tablet Take 50 mg by mouth daily.    cholecalciferol (VITAMIN D) 1000 units tablet Take 1,000 Units by mouth daily.    Cyanocobalamin 1000 MCG/ML KIT Inject 1 mL as directed every 30 (thirty) days.    levothyroxine (SYNTHROID, LEVOTHROID) 50 MCG tablet Take 50 mcg by mouth daily before breakfast.      STOP taking these medications     oxyCODONE (ROXICODONE) 5 MG immediate release tablet      senna-docusate (SENOKOT-S) 8.6-50 MG tablet         If you experience worsening of your admission symptoms, develop shortness of breath, life threatening emergency, suicidal or homicidal thoughts you must seek medical attention immediately by calling 911 or calling your MD immediately  if symptoms less severe.  You Must read complete instructions/literature along with all the possible adverse reactions/side effects for all the Medicines you take and that have been prescribed to you. Take any new Medicines after you have completely understood and accept all the possible adverse reactions/side effects.   Please note  You were cared for by a hospitalist during your hospital stay. If you have any questions about your discharge medications or the care you received while you were in the hospital after you are discharged, you can call the unit and asked to speak with the hospitalist on call if the hospitalist that took care of you is not available. Once you are discharged,  your primary care physician will handle any further medical issues. Please note that NO REFILLS for any discharge medications will be authorized once you are discharged, as it is imperative that you return to your primary care physician (or establish a relationship with a primary care physician if you do not have one) for your aftercare needs so that they can reassess your need for medications and monitor your lab values. Today   SUBJECTIVE  Doing well   VITAL  SIGNS:  Blood pressure (!) 151/62, pulse 70, temperature 99.4 F (37.4 C), temperature source Oral, resp. rate 16, height 5' (1.524 m), weight 50.6 kg (111 lb 9.6 oz), SpO2 92 %.  I/O:   Intake/Output Summary (Last 24 hours) at 01/02/17 0907 Last data filed at 01/01/17 1752  Gross per 24 hour  Intake              240 ml  Output                0 ml  Net              240 ml    PHYSICAL EXAMINATION:  GENERAL:  83 y.o.-year-old patient lying in the bed with no acute distress.  EYES: Pupils equal, round, reactive to light and accommodation. No scleral icterus. Extraocular muscles intact.  HEENT: Head atraumatic, normocephalic. Oropharynx and nasopharynx clear.  NECK:  Supple, no jugular venous distention. No thyroid enlargement, no tenderness.  LUNGS: Normal breath sounds bilaterally, no wheezing, rales,rhonchi or crepitation. No use of accessory muscles of respiration.  CARDIOVASCULAR: S1, S2 normal. No murmurs, rubs, or gallops.  ABDOMEN: Soft, non-tender, non-distended. Bowel sounds present. No organomegaly or mass.  EXTREMITIES: No pedal edema, cyanosis, or clubbing.  NEUROLOGIC: Cranial nerves II through XII are intact. Muscle strength 5/5 in all extremities. Sensation intact. Gait not checked.  PSYCHIATRIC: The patient is alert and oriented x 3.  SKIN: No obvious rash, lesion, or ulcer.   DATA REVIEW:   CBC   Recent Labs Lab 01/02/17 0406  WBC 8.4  HGB 9.5*  HCT 27.2*  PLT 168    Chemistries   Recent Labs Lab 12/31/16 0418  01/02/17 0406  NA 137  < > 132*  K 4.0  < > 4.2  CL 108  < > 99*  CO2 27  < > 27  GLUCOSE 113*  < > 127*  BUN 24*  < > 21*  CREATININE 0.80  < > 0.90  CALCIUM 8.5*  < > 8.1*  AST 19  --   --   ALT 11*  --   --   ALKPHOS 46  --   --   BILITOT 0.7  --   --   < > = values in this interval not displayed.  Microbiology Results   Recent Results (from the past 240 hour(s))  Surgical pcr screen     Status: None   Collection Time: 12/31/16   6:21 AM  Result Value Ref Range Status   MRSA, PCR NEGATIVE NEGATIVE Final   Staphylococcus aureus NEGATIVE NEGATIVE Final    Comment:        The Xpert SA Assay (FDA approved for NASAL specimens in patients over 81 years of age), is one component of a comprehensive surveillance program.  Test performance has been validated by [facility] for patients greater than or equal to 85 year old. It is not intended to diagnose infection nor to guide or monitor treatment.     RADIOLOGY:  Dg Hip Operative Unilat W Or W/o Pelvis Left  Result Date: 12/31/2016 CLINICAL DATA:  Intraoperative evaluation for left femoral neck fracture. EXAM: OPERATIVE left HIP (WITH PELVIS IF PERFORMED) 3 VIEWS TECHNIQUE: Fluoroscopic spot image(s) were submitted for interpretation post-operatively. COMPARISON:  12/30/2016 FINDINGS: 3 spot fluoro images show placement of a helical blade across a comminuted intertrochanteric femoral neck fracture. Bony alignment is improved compared to the pre reduction film. No evidence for immediate hardware complication. IMPRESSION: Improved alignment after ORIF for left femoral neck fracture. No evidence for immediate hardware complications. Electronically Signed   By: Misty Stanley M.D.   On: 12/31/2016 12:03     Management plans discussed with the patient, family and they are in agreement.  CODE STATUS:     Code Status Orders        Start     Ordered   12/30/16 2231  Full code  Continuous     12/30/16 2231    Code Status History    Date Active Date Inactive Code Status Order ID Comments User Context   12/25/2015  9:49 PM 12/26/2015  7:32 PM Full Code [id number]  Reubin Milan, MD Inpatient   12/24/2015  2:21 AM 12/25/2015  9:49 PM Full Code [id number]  Lance Coon, MD ED      TOTAL TIME TAKING CARE OF THIS PATIENT: 40 minutes.    Amonte Brookover M.D on 01/02/2017 at 9:07 AM  Between 7am to 6pm - Pager - [phone] After 6pm go to [email or web address] - password [email or web address]  [facility]  Office  [phone]  CC: Primary care physician; Lynnell Jude, MD

## 2017-01-02 NOTE — Progress Notes (Signed)
Subjective: 2 Days Post-Op Procedure(s) (LRB): INTRAMEDULLARY (IM) NAIL INTERTROCHANTRIC (Left)    Patient reports pain as none.  Objective: doing well.  csm good and dressing dry.  PT progressing   VITALS:   Vitals:   01/02/17 0020 01/02/17 0749  BP: (!) 160/71 (!) 151/62  Pulse: 85 70  Resp: 16   Temp: 99.2 F (37.3 C) 99.4 F (37.4 C)    Neurologically intact ABD soft Neurovascular intact Sensation intact distally Intact pulses distally Dorsiflexion/Plantar flexion intact Incision: no drainage  LABS  Recent Labs  12/31/16 0418 01/01/17 0504 01/02/17 0406  HGB 10.6* 9.1* 9.5*  HCT 30.4* 25.9* 27.2*  WBC 7.1 7.3 8.4  PLT 190 166 168     Recent Labs  12/31/16 0418 01/01/17 0504 01/02/17 0406  NA 137 133* 132*  K 4.0 4.1 4.2  BUN 24* 19 21*  CREATININE 0.80 0.98 0.90  GLUCOSE 113* 120* 127*     Recent Labs  12/30/16 1556  INR 1.02     Assessment/Plan: 2 Days Post-Op Procedure(s) (LRB): INTRAMEDULLARY (IM) NAIL INTERTROCHANTRIC (Left)   Up with therapy Discharge to SNF   RTC 2 weeks  ASA bid for 6 weeks

## 2017-01-02 NOTE — Progress Notes (Signed)
Patient is medically stable for D/C to [facility] today. Per [facility] admissions coordinator at [facility] patient can come today to room 354. RN will call report at [phone] and arrange EMS for transport. Clinical Education officer, museum (CSW) sent D/C orders to [facility] [employer] [facility] via [employer]. CSW met with patient and made her aware of above, however she appeared confused. CSW contacted patient's son Heloise Purpura and made him aware of above. Please reconsult if future social work needs arise. CSW signing off.   [employer], LCSW [phone]

## 2017-01-11 ENCOUNTER — Encounter: Payer: Self-pay | Admitting: Gerontology

## 2017-01-11 ENCOUNTER — Non-Acute Institutional Stay (SKILLED_NURSING_FACILITY): Payer: Medicare Other | Admitting: Gerontology

## 2017-01-11 DIAGNOSIS — S72002D Fracture of unspecified part of neck of left femur, subsequent encounter for closed fracture with routine healing: Secondary | ICD-10-CM | POA: Diagnosis not present

## 2017-01-11 NOTE — Progress Notes (Signed)
Location:   [place] Room Number: [phone] Place of Service:  SNF [phone]) Provider:  Toni Arthurs, NP-C  Clemmie Krill Lynnell Jude, MD  Patient Care Team: Lynnell Jude, MD as PCP - General (Family Medicine)  Extended Emergency Contact Information Primary Emergency Contact: Reynaldo Minium [place] Phone: [phone] Relation: Son Secondary Emergency Contact: Sharene Butters [place] Phone: [phone] Mobile Phone: [phone] Relation: Son  Code Status:  FULL Goals of care: Advanced Directive information Advanced Directives 01/11/2017  Does Patient Have a Medical Advance Directive? No  Type of Advance Directive -  Does patient want to make changes to medical advance directive? -  Copy of [place] in Chart? -  Would patient like information on creating a medical advance directive? -     Chief Complaint  Patient presents with  . Medical Management of Chronic Issues    Routine Visit    HPI:  Pt is a 83 y.o. female seen today for follow up. Pt was admitted to the facility for rehab following hospitalization for left hip fracture with surgical intervention. Pt has been participating with PT and OT. Pt reports she is feeling well. She reports her appetite is good and is having regular BMs. She reports her pain is well controlled at this point. Family is concerned about her sleeping a lot and wants the pain meds decreased. However, PT reports she is having lots of pain during therapy sessions. Will leave meds as they are for right now and continue to monitor closely. VSS. No other complaints.     Past Medical History:  Diagnosis Date  . Hypertension   . Hypothyroidism   . Multiple sclerosis ([place])    Past Surgical History:  Procedure Laterality Date  . INTRAMEDULLARY (IM) NAIL INTERTROCHANTERIC Left 12/31/2016   Procedure: INTRAMEDULLARY (IM) NAIL INTERTROCHANTRIC;  Surgeon: Earnestine Leys, MD;   Location: [facility] ORS;  Service: Orthopedics;  Laterality: Left;  Marland Kitchen VITRECTOMY      No Known Allergies  Allergies as of 01/11/2017   No Known Allergies     Medication List       Accurate as of 01/11/17  2:06 PM. Always use your most recent med list.          acetaminophen 500 MG tablet Commonly known as:  TYLENOL Take 500 mg by mouth every 6 (six) hours as needed.   atenolol 50 MG tablet Commonly known as:  TENORMIN Take 50 mg by mouth daily.   cholecalciferol 1000 units tablet Commonly known as:  VITAMIN D Take 1,000 Units by mouth daily.   Cyanocobalamin 1000 MCG/ML Kit Inject 1 mL as directed every 30 (thirty) days. 1st of month at 6 am   enoxaparin 30 MG/0.3ML injection Commonly known as:  LOVENOX Inject 0.3 mLs (30 mg total) into the skin daily.   ferrous sulfate 325 (65 FE) MG tablet Take 1 tablet (325 mg total) by mouth daily with breakfast.   HYDROcodone-acetaminophen 5-325 MG tablet Commonly known as:  NORCO/VICODIN Take 1 tablet by mouth 4 (four) times daily. Scheduled for pain. Ok to hold for sedation. Ok to give prn doses in addition to scheduled for severe pain.   HYDROcodone-acetaminophen 5-325 MG tablet Commonly known as:  NORCO/VICODIN Take 1 tablet by mouth every 6 (six) hours as needed for moderate pain.   levothyroxine 50 MCG tablet Commonly known as:  SYNTHROID, LEVOTHROID Take 50 mcg by mouth daily before breakfast.   magnesium  hydroxide 400 MG/5ML suspension Commonly known as:  MILK OF MAGNESIA Take 30 mLs by mouth every 4 (four) hours as needed for mild constipation. For constipation/ no BM for 2 days       Review of Systems  Constitutional: Negative for activity change, appetite change, chills, diaphoresis and fever.  HENT: Negative for congestion, sneezing, sore throat, trouble swallowing and voice change.   Respiratory: Negative for apnea, cough, choking, chest tightness, shortness of breath and wheezing.   Cardiovascular: Negative  for chest pain, palpitations and leg swelling.  Gastrointestinal: Negative for abdominal distention, abdominal pain, constipation, diarrhea and nausea.  Genitourinary: Negative for difficulty urinating, dysuria, frequency and urgency.  Musculoskeletal: Positive for arthralgias (typical arthritis). Negative for back pain, gait problem and myalgias.  Skin: Positive for wound. Negative for color change, pallor and rash.  Neurological: Negative for dizziness, tremors, syncope, speech difficulty, weakness, numbness and headaches.  Psychiatric/Behavioral: Negative for agitation and behavioral problems.  All other systems reviewed and are negative.   Immunization History  Administered Date(s) Administered  . PPD Test 01/10/2017   There are no preventive care reminders to display for this patient. No flowsheet data found. Functional Status Survey:    Vitals:   01/11/17 1331  BP: (!) 117/57  Pulse: 71  Resp: 18  Temp: 99.5 F (37.5 C)  SpO2: 94%  Weight: 114 lb 11.2 oz (52 kg)  Height: 5' (1.524 m)   Body mass index is 22.4 kg/m. Physical Exam  Constitutional: She is oriented to person, place, and time. Vital signs are normal. She appears well-developed and well-nourished. She is active and cooperative. She does not appear ill. No distress.  HENT:  Head: Normocephalic and atraumatic.  Mouth/Throat: Uvula is midline, oropharynx is clear and moist and mucous membranes are normal. Mucous membranes are not pale, not dry and not cyanotic.  Eyes: Pupils are equal, round, and reactive to light. Conjunctivae, EOM and lids are normal.  Neck: Trachea normal, normal range of motion and full passive range of motion without pain. Neck supple. No JVD present. No tracheal deviation, no edema and no erythema present. No thyromegaly present.  Cardiovascular: Normal rate, regular rhythm, normal heart sounds, intact distal pulses and normal pulses.  Exam reveals no gallop, no distant heart sounds and no  friction rub.   No murmur heard. Pulmonary/Chest: Effort normal and breath sounds normal. No accessory muscle usage. No respiratory distress. She has no wheezes. She has no rales. She exhibits no tenderness.  Abdominal: Normal appearance and bowel sounds are normal. She exhibits no distension and no ascites. There is no tenderness.  Musculoskeletal: She exhibits no edema or tenderness.       Left hip: She exhibits decreased range of motion, decreased strength and laceration.  Expected osteoarthritis, stiffness. Calves soft, supple. Negative Homan's sign  Neurological: She is alert and oriented to person, place, and time. She has normal strength.  Skin: Skin is warm and dry. Laceration (incision) noted. No rash noted. She is not diaphoretic. No cyanosis or erythema. No pallor. Nails show no clubbing.  Psychiatric: She has a normal mood and affect. Her speech is normal and behavior is normal. Judgment and thought content normal. Cognition and memory are normal.  Nursing note and vitals reviewed.   Labs reviewed:  Recent Labs  12/31/16 0418 01/01/17 0504 01/02/17 0406  NA 137 133* 132*  K 4.0 4.1 4.2  CL 108 101 99*  CO2 _0 GLUCOSE 113* 120* 127*  BUN 24*  19 21*  CREATININE 0.80 0.98 0.90  CALCIUM 8.5* 7.9* 8.1*    Recent Labs  12/31/16 0418  AST 19  ALT 11*  ALKPHOS 46  BILITOT 0.7  PROT 6.0*  ALBUMIN 3.2*    Recent Labs  12/30/16 1556 12/31/16 0418 01/01/17 0504 01/02/17 0406  WBC 6.0 7.1 7.3 8.4  NEUTROABS 3.8  --   --   --   HGB 12.5 10.6* 9.1* 9.5*  HCT 36.7 30.4* 25.9* 27.2*  MCV 101.3* 100.3* 100.2* 100.7*  PLT 245 190 166 168   Lab Results  Component Value Date   TSH 3.269 12/31/2016   No results found for: HGBA1C No results found for: CHOL, HDL, LDLCALC, LDLDIRECT, TRIG, CHOLHDL  Significant Diagnostic Results in last 30 days:  Dg Chest 1 View  Result Date: 12/30/2016 CLINICAL DATA:  Pain following fall EXAM: CHEST 1 VIEW COMPARISON:  None.  FINDINGS: There is no edema or consolidation. Heart is mildly enlarged with pulmonary vascularity within normal limits. There is aortic atherosclerosis. No adenopathy. No pneumothorax. No evident bone lesions. IMPRESSION: Mild cardiomegaly. Aortic atherosclerosis. No edema or consolidation. Aortic Atherosclerosis (ICD10-I70.0). Electronically Signed   By: Lowella Grip III M.D.   On: 12/30/2016 16:42   Dg Hip Operative Unilat W Or W/o Pelvis Left  Result Date: 12/31/2016 CLINICAL DATA:  Intraoperative evaluation for left femoral neck fracture. EXAM: OPERATIVE left HIP (WITH PELVIS IF PERFORMED) 3 VIEWS TECHNIQUE: Fluoroscopic spot image(s) were submitted for interpretation post-operatively. COMPARISON:  12/30/2016 FINDINGS: 3 spot fluoro images show placement of a helical blade across a comminuted intertrochanteric femoral neck fracture. Bony alignment is improved compared to the pre reduction film. No evidence for immediate hardware complication. IMPRESSION: Improved alignment after ORIF for left femoral neck fracture. No evidence for immediate hardware complications. Electronically Signed   By: Misty Stanley M.D.   On: 12/31/2016 12:03   Dg Hip Unilat W Or Wo Pelvis 2-3 Views Left  Result Date: 12/30/2016 CLINICAL DATA:  Fall.  Left-sided hip pain. EXAM: DG HIP (WITH OR WITHOUT PELVIS) 2-3V LEFT COMPARISON:  None. FINDINGS: Frontal pelvis with AP and frog-leg lateral views of the left hip show a comminuted left intertrochanteric femoral neck fracture. Varus angulation associated. Bones are diffusely demineralized. IMPRESSION: Comminuted left intertrochanteric femoral neck fracture with varus angulation. Electronically Signed   By: Misty Stanley M.D.   On: 12/30/2016 16:45    Assessment/Plan 1. Closed fracture of left hip with routine healing, subsequent encounter  Continue working with PT/OT  Continue exercises as taught by PT/OT  Continue Lovenox 30 mg SQ Q Day for DVT prophylaxis  Continue  Hydrocodone 5/325 mg po QID scheduled for pain  Encourage po fluid intake  Encourage OOB to chair  Wound care per protocol  Family/ staff Communication:   Total Time:  Documentation:  Face to Face:  Family/Phone:   Labs/tests ordered:    Medication list reviewed and assessed for continued appropriateness. Monthly medication orders reviewed and signed.  Vikki Ports, NP-C [facility] [phone], [place] [address] Cell Phone (Mon-Fri 8am-5pm):  [phone] On Call:  [phone] & follow prompts after 5pm & weekends Office Phone:  [phone] Office Fax:  [phone]

## 2017-01-12 ENCOUNTER — Non-Acute Institutional Stay (SKILLED_NURSING_FACILITY): Payer: Medicare Other | Admitting: Gerontology

## 2017-01-12 DIAGNOSIS — S72002D Fracture of unspecified part of neck of left femur, subsequent encounter for closed fracture with routine healing: Secondary | ICD-10-CM | POA: Diagnosis not present

## 2017-01-12 DIAGNOSIS — IMO0001 Reserved for inherently not codable concepts without codable children: Secondary | ICD-10-CM

## 2017-01-12 DIAGNOSIS — T814XXA Infection following a procedure, initial encounter: Secondary | ICD-10-CM | POA: Diagnosis not present

## 2017-01-13 ENCOUNTER — Other Ambulatory Visit
Admission: RE | Admit: 2017-01-13 | Discharge: 2017-01-13 | Disposition: A | Discharge status: Home / Self Care | Payer: No Typology Code available for payment source | Source: Skilled Nursing Facility | Attending: Gerontology | Admitting: Gerontology

## 2017-01-13 DIAGNOSIS — E871 Hypo-osmolality and hyponatremia: Secondary | ICD-10-CM | POA: Insufficient documentation

## 2017-01-13 DIAGNOSIS — T814XXA Infection following a procedure, initial encounter: Secondary | ICD-10-CM | POA: Insufficient documentation

## 2017-01-13 LAB — CBC WITH DIFFERENTIAL/PLATELET
BASOS PCT: 0 %
Basophils Absolute: 0.1 10*3/uL (ref 0–0.1)
EOS ABS: 0.2 10*3/uL (ref 0–0.7)
EOS PCT: 1 %
HEMATOCRIT: 24.5 % — AB (ref 35.0–47.0)
Hemoglobin: 8.3 g/dL — ABNORMAL LOW (ref 12.0–16.0)
Lymphocytes Relative: 7 %
Lymphs Abs: 1.1 10*3/uL (ref 1.0–3.6)
MCH: 34.6 pg — ABNORMAL HIGH (ref 26.0–34.0)
MCHC: 34 g/dL (ref 32.0–36.0)
MCV: 101.8 fL — ABNORMAL HIGH (ref 80.0–100.0)
MONO ABS: 0.9 10*3/uL (ref 0.2–0.9)
MONOS PCT: 6 %
Neutro Abs: 14.4 10*3/uL — ABNORMAL HIGH (ref 1.4–6.5)
Neutrophils Relative %: 86 %
PLATELETS: 363 10*3/uL (ref 150–440)
RBC: 2.4 MIL/uL — ABNORMAL LOW (ref 3.80–5.20)
RDW: 14 % (ref 11.5–14.5)
WBC: 16.7 10*3/uL — ABNORMAL HIGH (ref 3.6–11.0)

## 2017-01-13 LAB — COMPREHENSIVE METABOLIC PANEL
ALBUMIN: 2.8 g/dL — AB (ref 3.5–5.0)
ALT: 16 U/L (ref 14–54)
ANION GAP: 9 (ref 5–15)
AST: 56 U/L — ABNORMAL HIGH (ref 15–41)
Alkaline Phosphatase: 70 U/L (ref 38–126)
BILIRUBIN TOTAL: 0.9 mg/dL (ref 0.3–1.2)
BUN: 42 mg/dL — ABNORMAL HIGH (ref 6–20)
CHLORIDE: 97 mmol/L — AB (ref 101–111)
CO2: 27 mmol/L (ref 22–32)
Calcium: 7.8 mg/dL — ABNORMAL LOW (ref 8.9–10.3)
Creatinine, Ser: 1.74 mg/dL — ABNORMAL HIGH (ref 0.44–1.00)
GFR calc Af Amer: 28 mL/min — ABNORMAL LOW (ref >60)
GFR calc non Af Amer: 24 mL/min — ABNORMAL LOW (ref >60)
GLUCOSE: 103 mg/dL — AB (ref 65–99)
POTASSIUM: 4.2 mmol/L (ref 3.5–5.1)
Sodium: 133 mmol/L — ABNORMAL LOW (ref 135–145)
TOTAL PROTEIN: 6 g/dL — AB (ref 6.5–8.1)

## 2017-01-15 ENCOUNTER — Encounter: Payer: Self-pay | Admitting: Gerontology

## 2017-01-15 NOTE — Progress Notes (Signed)
Location:   [facility] Room Number: [phone] Place of Service:  SNF [phone]) Provider:  Toni Arthurs, NP-C  Clemmie Krill Lynnell Jude, MD  Patient Care Team: Lynnell Jude, MD as PCP - General (Family Medicine)  Extended Emergency Contact Information Primary Emergency Contact: Reynaldo Minium [place] Phone: [phone] Relation: Son Secondary Emergency Contact: Sharene Butters [place] of [place] Phone: [phone] Mobile Phone: [phone] Relation: Son  Code Status:  FULL Goals of care: Advanced Directive information Advanced Directives 01/15/2017  Does Patient Have a Medical Advance Directive? No  Type of Advance Directive -  Does patient want to make changes to medical advance directive? -  Copy of [place] in Chart? -  Would patient like information on creating a medical advance directive? -     Chief Complaint  Patient presents with  . Acute Visit    Follow up on incision infection    HPI:  Pt is a 83 y.o. female seen today for an acute visit for increased pain in the left hip. She is moaning and holding her hip a lot today. Temp 100.6. Peri-incisional area reddened, warm to touch. Yellow purulent drainage from an open area in the incision. Mild edema in the area. Pt reports she generally doesn't feel well.     Past Medical History:  Diagnosis Date  . Hypertension   . Hypothyroidism   . Multiple sclerosis ([place])    Past Surgical History:  Procedure Laterality Date  . INTRAMEDULLARY (IM) NAIL INTERTROCHANTERIC Left 12/31/2016   Procedure: INTRAMEDULLARY (IM) NAIL INTERTROCHANTRIC;  Surgeon: Earnestine Leys, MD;  Location: [facility] ORS;  Service: Orthopedics;  Laterality: Left;  Marland Kitchen VITRECTOMY      No Known Allergies  Allergies as of 01/12/2017   No Known Allergies     Medication List       Accurate as of 01/12/17 11:59 PM. Always use your most recent med list.          acetaminophen 500 MG  tablet Commonly known as:  TYLENOL Take 500 mg by mouth every 6 (six) hours as needed.   atenolol 50 MG tablet Commonly known as:  TENORMIN Take 50 mg by mouth daily.   cephALEXin 500 MG capsule Commonly known as:  KEFLEX Take 500 mg by mouth 4 (four) times daily.   cholecalciferol 1000 units tablet Commonly known as:  VITAMIN D Take 1,000 Units by mouth daily.   Cyanocobalamin 1000 MCG/ML Kit Inject 1 mL as directed every 30 (thirty) days. 1st of month at 6 am   enoxaparin 30 MG/0.3ML injection Commonly known as:  LOVENOX Inject 0.3 mLs (30 mg total) into the skin daily.   ENSURE ENLIVE PO Take 1 Bottle by mouth 2 (two) times daily between meals.   feeding supplement (PRO-STAT SUGAR FREE 64) Liqd Take 30 mLs by mouth 2 (two) times daily between meals.   ferrous sulfate 325 (65 FE) MG tablet Take 325 mg by mouth 3 (three) times daily with meals.   HYDROcodone-acetaminophen 5-325 MG tablet Commonly known as:  NORCO/VICODIN Take 1 tablet by mouth 4 (four) times daily. Scheduled for pain. Ok to hold for sedation. Ok to give prn doses in addition to scheduled for severe pain.   HYDROcodone-acetaminophen 5-325 MG tablet Commonly known as:  NORCO/VICODIN Take 1 tablet by mouth every 6 (six) hours as needed for moderate pain.   levothyroxine 50 MCG tablet Commonly known as:  SYNTHROID, LEVOTHROID Take 50 mcg by  mouth daily before breakfast.   magnesium hydroxide 400 MG/5ML suspension Commonly known as:  MILK OF MAGNESIA Take 30 mLs by mouth every 4 (four) hours as needed for mild constipation. For constipation/ no BM for 2 days   vitamin C 500 MG tablet Commonly known as:  ASCORBIC ACID Take 500 mg by mouth 2 (two) times daily.       Review of Systems  Constitutional: Negative for activity change, appetite change, chills, diaphoresis and fever.  HENT: Negative for congestion, sneezing, sore throat, trouble swallowing and voice change.   Respiratory: Negative for  apnea, cough, choking, chest tightness, shortness of breath and wheezing.   Cardiovascular: Negative for chest pain, palpitations and leg swelling.  Gastrointestinal: Negative for abdominal distention, abdominal pain, constipation, diarrhea and nausea.  Genitourinary: Negative for difficulty urinating, dysuria, frequency and urgency.  Musculoskeletal: Positive for arthralgias (typical arthritis). Negative for back pain, gait problem and myalgias.  Skin: Positive for wound. Negative for color change, pallor and rash.  Neurological: Negative for dizziness, tremors, syncope, speech difficulty, weakness, numbness and headaches.  Psychiatric/Behavioral: Negative for agitation and behavioral problems.  All other systems reviewed and are negative.   Immunization History  Administered Date(s) Administered  . PPD Test 01/10/2017   There are no preventive care reminders to display for this patient. No flowsheet data found. Functional Status Survey:    Vitals:   01/15/17 1413  BP: (!) 117/57  Pulse: 71  Resp: 18  Temp: 99.5 F (37.5 C)  SpO2: 94%  Weight: 114 lb 11.2 oz (52 kg)  Height: 5' (1.524 m)   Body mass index is 22.4 kg/m. Physical Exam  Constitutional: She is oriented to person, place, and time. Vital signs are normal. She appears well-developed and well-nourished. She is active and cooperative. She does not appear ill. No distress.  HENT:  Head: Normocephalic and atraumatic.  Mouth/Throat: Uvula is midline, oropharynx is clear and moist and mucous membranes are normal. Mucous membranes are not pale, not dry and not cyanotic.  Eyes: Pupils are equal, round, and reactive to light. Conjunctivae, EOM and lids are normal.  Neck: Trachea normal, normal range of motion and full passive range of motion without pain. Neck supple. No JVD present. No tracheal deviation, no edema and no erythema present. No thyromegaly present.  Cardiovascular: Normal rate, regular rhythm, normal heart  sounds, intact distal pulses and normal pulses.  Exam reveals no gallop, no distant heart sounds and no friction rub.   No murmur heard. Pulmonary/Chest: Effort normal and breath sounds normal. No accessory muscle usage. No respiratory distress. She has no wheezes. She has no rales. She exhibits no tenderness.  Abdominal: Normal appearance and bowel sounds are normal. She exhibits no distension and no ascites. There is no tenderness.  Musculoskeletal: She exhibits no edema or tenderness.       Left hip: She exhibits decreased range of motion, decreased strength and laceration.  Expected osteoarthritis, stiffness. Calves soft, supple. Negative Homan's sign  Neurological: She is alert and oriented to person, place, and time. She has normal strength.  Skin: Skin is warm and dry. Laceration (incision) noted. No rash noted. She is not diaphoretic. No cyanosis or erythema. No pallor. Nails show no clubbing.  Psychiatric: She has a normal mood and affect. Her speech is normal and behavior is normal. Judgment and thought content normal. Cognition and memory are normal.  Nursing note and vitals reviewed.   Labs reviewed:  Recent Labs  01/01/17 0504 01/02/17 0406 01/12/17  2350  NA 133* 132* 133*  K 4.1 4.2 4.2  CL 101 99* 97*  CO2 26 27 27   GLUCOSE 120* 127* 103*  BUN 19 21* 42*  CREATININE 0.98 0.90 1.74*  CALCIUM 7.9* 8.1* 7.8*    Recent Labs  12/31/16 0418 01/12/17 2350  AST 19 56*  ALT 11* 16  ALKPHOS 46 70  BILITOT 0.7 0.9  PROT 6.0* 6.0*  ALBUMIN 3.2* 2.8*    Recent Labs  12/30/16 1556  01/01/17 0504 01/02/17 0406 01/12/17 2350  WBC 6.0  < > 7.3 8.4 16.7*  NEUTROABS 3.8  --   --   --  14.4*  HGB 12.5  < > 9.1* 9.5* 8.3*  HCT 36.7  < > 25.9* 27.2* 24.5*  MCV 101.3*  < > 100.2* 100.7* 101.8*  PLT 245  < > 166 168 363  < > = values in this interval not displayed. Lab Results  Component Value Date   TSH 3.269 12/31/2016   No results found for: HGBA1C No results  found for: CHOL, HDL, LDLCALC, LDLDIRECT, TRIG, CHOLHDL  Significant Diagnostic Results in last 30 days:  Dg Chest 1 View  Result Date: 12/30/2016 CLINICAL DATA:  Pain following fall EXAM: CHEST 1 VIEW COMPARISON:  None. FINDINGS: There is no edema or consolidation. Heart is mildly enlarged with pulmonary vascularity within normal limits. There is aortic atherosclerosis. No adenopathy. No pneumothorax. No evident bone lesions. IMPRESSION: Mild cardiomegaly. Aortic atherosclerosis. No edema or consolidation. Aortic Atherosclerosis (ICD10-I70.0). Electronically Signed   By: Lowella Grip III M.D.   On: 12/30/2016 16:42   Dg Hip Operative Unilat W Or W/o Pelvis Left  Result Date: 12/31/2016 CLINICAL DATA:  Intraoperative evaluation for left femoral neck fracture. EXAM: OPERATIVE left HIP (WITH PELVIS IF PERFORMED) 3 VIEWS TECHNIQUE: Fluoroscopic spot image(s) were submitted for interpretation post-operatively. COMPARISON:  12/30/2016 FINDINGS: 3 spot fluoro images show placement of a helical blade across a comminuted intertrochanteric femoral neck fracture. Bony alignment is improved compared to the pre reduction film. No evidence for immediate hardware complication. IMPRESSION: Improved alignment after ORIF for left femoral neck fracture. No evidence for immediate hardware complications. Electronically Signed   By: Misty Stanley M.D.   On: 12/31/2016 12:03   Dg Hip Unilat W Or Wo Pelvis 2-3 Views Left  Result Date: 12/30/2016 CLINICAL DATA:  Fall.  Left-sided hip pain. EXAM: DG HIP (WITH OR WITHOUT PELVIS) 2-3V LEFT COMPARISON:  None. FINDINGS: Frontal pelvis with AP and frog-leg lateral views of the left hip show a comminuted left intertrochanteric femoral neck fracture. Varus angulation associated. Bones are diffusely demineralized. IMPRESSION: Comminuted left intertrochanteric femoral neck fracture with varus angulation. Electronically Signed   By: Misty Stanley M.D.   On: 12/30/2016 16:45     Assessment/Plan 1. Closed fracture of left hip with routine healing, subsequent encounter  Continue working with PT/OT  Continue exercises as taught by PT/OT  Continue Lovenox 30 mg SQ Q Day for DVT prophylaxis  Continue Hydrocodone 5/325 mg po QID scheduled for pain  Encourage po fluid intake  Encourage OOB to chair  Wound care per protocol  2. Incisional Infection  Cephalexin 500 mg po Q 6 hours x 7 days  Wound care per protocol  Family/ staff Communication:   Total Time:  Documentation:  Face to Face:  Family/Phone: spoke with son in the hallway    Labs/tests ordered:  Left hip xray, cbc, met c  Medication list reviewed and assessed for continued appropriateness.  Vikki Ports, NP-C Geriatrics [facility] [phone], [place] [address] Cell Phone (Mon-Fri 8am-5pm):  [phone] On Call:  [phone] & follow prompts after 5pm & weekends Office Phone:  [phone] Office Fax:  [phone]

## 2017-01-16 ENCOUNTER — Other Ambulatory Visit
Admission: RE | Admit: 2017-01-16 | Discharge: 2017-01-16 | Disposition: A | Discharge status: Home / Self Care | Payer: No Typology Code available for payment source | Source: Ambulatory Visit | Attending: Gerontology | Admitting: Gerontology

## 2017-01-16 DIAGNOSIS — D649 Anemia, unspecified: Secondary | ICD-10-CM | POA: Insufficient documentation

## 2017-01-16 DIAGNOSIS — E871 Hypo-osmolality and hyponatremia: Secondary | ICD-10-CM | POA: Insufficient documentation

## 2017-01-17 LAB — COMPREHENSIVE METABOLIC PANEL
ALT: 13 U/L — ABNORMAL LOW (ref 14–54)
ANION GAP: 9 (ref 5–15)
AST: 22 U/L (ref 15–41)
Albumin: 2.7 g/dL — ABNORMAL LOW (ref 3.5–5.0)
Alkaline Phosphatase: 85 U/L (ref 38–126)
BUN: 47 mg/dL — ABNORMAL HIGH (ref 6–20)
CHLORIDE: 104 mmol/L (ref 101–111)
CO2: 25 mmol/L (ref 22–32)
Calcium: 8.2 mg/dL — ABNORMAL LOW (ref 8.9–10.3)
Creatinine, Ser: 1.1 mg/dL — ABNORMAL HIGH (ref 0.44–1.00)
GFR, EST AFRICAN AMERICAN: 48 mL/min — AB (ref >60)
GFR, EST NON AFRICAN AMERICAN: 42 mL/min — AB (ref >60)
Glucose, Bld: 102 mg/dL — ABNORMAL HIGH (ref 65–99)
POTASSIUM: 4.7 mmol/L (ref 3.5–5.1)
Sodium: 138 mmol/L (ref 135–145)
Total Bilirubin: 0.5 mg/dL (ref 0.3–1.2)
Total Protein: 6.6 g/dL (ref 6.5–8.1)

## 2017-01-17 LAB — CBC WITH DIFFERENTIAL/PLATELET
BASOS ABS: 0.1 10*3/uL (ref 0–0.1)
Basophils Relative: 1 %
EOS PCT: 4 %
Eosinophils Absolute: 0.3 10*3/uL (ref 0–0.7)
HCT: 24.5 % — ABNORMAL LOW (ref 35.0–47.0)
Hemoglobin: 8.1 g/dL — ABNORMAL LOW (ref 12.0–16.0)
LYMPHS ABS: 1.1 10*3/uL (ref 1.0–3.6)
LYMPHS PCT: 13 %
MCH: 33.2 pg (ref 26.0–34.0)
MCHC: 32.9 g/dL (ref 32.0–36.0)
MCV: 100.9 fL — AB (ref 80.0–100.0)
MONO ABS: 0.7 10*3/uL (ref 0.2–0.9)
Monocytes Relative: 8 %
Neutro Abs: 6.4 10*3/uL (ref 1.4–6.5)
Neutrophils Relative %: 74 %
PLATELETS: 439 10*3/uL (ref 150–440)
RBC: 2.43 MIL/uL — ABNORMAL LOW (ref 3.80–5.20)
RDW: 13.8 % (ref 11.5–14.5)
WBC: 8.6 10*3/uL (ref 3.6–11.0)

## 2017-01-19 ENCOUNTER — Non-Acute Institutional Stay (SKILLED_NURSING_FACILITY): Payer: Medicare Other | Admitting: Gerontology

## 2017-01-19 DIAGNOSIS — J189 Pneumonia, unspecified organism: Secondary | ICD-10-CM

## 2017-01-19 DIAGNOSIS — J181 Lobar pneumonia, unspecified organism: Secondary | ICD-10-CM

## 2017-01-23 ENCOUNTER — Non-Acute Institutional Stay (SKILLED_NURSING_FACILITY): Payer: Medicare Other | Admitting: Gerontology

## 2017-01-23 ENCOUNTER — Other Ambulatory Visit
Admission: RE | Admit: 2017-01-23 | Discharge: 2017-01-23 | Disposition: A | Discharge status: Home / Self Care | Payer: No Typology Code available for payment source | Source: Ambulatory Visit | Attending: Gerontology | Admitting: Gerontology

## 2017-01-23 DIAGNOSIS — J181 Lobar pneumonia, unspecified organism: Secondary | ICD-10-CM | POA: Diagnosis not present

## 2017-01-23 DIAGNOSIS — S72002D Fracture of unspecified part of neck of left femur, subsequent encounter for closed fracture with routine healing: Secondary | ICD-10-CM

## 2017-01-23 DIAGNOSIS — IMO0001 Reserved for inherently not codable concepts without codable children: Secondary | ICD-10-CM

## 2017-01-23 DIAGNOSIS — J189 Pneumonia, unspecified organism: Secondary | ICD-10-CM

## 2017-01-23 DIAGNOSIS — T814XXA Infection following a procedure, initial encounter: Secondary | ICD-10-CM

## 2017-01-23 DIAGNOSIS — D649 Anemia, unspecified: Secondary | ICD-10-CM | POA: Insufficient documentation

## 2017-01-23 LAB — OCCULT BLOOD X 1 CARD TO LAB, STOOL: Fecal Occult Bld: NEGATIVE

## 2017-01-24 ENCOUNTER — Encounter: Payer: Self-pay | Admitting: Gerontology

## 2017-01-24 ENCOUNTER — Encounter
Admission: RE | Admit: 2017-01-24 | Discharge: 2017-01-24 | Disposition: A | Discharge status: Home / Self Care | Payer: Medicare Other | Source: Ambulatory Visit | Attending: Internal Medicine | Admitting: Internal Medicine

## 2017-01-24 NOTE — Progress Notes (Signed)
Location:   [facility] Room Number: [id number] Place of Service:  SNF [phone]) Provider:  Toni Arthurs, NP-C  Clemmie Krill Lynnell Jude, MD  Patient Care Team: Lynnell Jude, MD as PCP - General (Family Medicine)  Extended Emergency Contact Information Primary Emergency Contact: Reynaldo Minium [place] of [place] Phone: [phone] Relation: Son Secondary Emergency Contact: Sharene Butters [place] of [place] Phone: [phone] Mobile Phone: [phone] Relation: Son  Code Status:  Full Goals of care: Advanced Directive information Advanced Directives 01/24/2017  Does Patient Have a Medical Advance Directive? No  Type of Advance Directive -  Does patient want to make changes to medical advance directive? -  Copy of [place] in Chart? -  Would patient like information on creating a medical advance directive? -     Chief Complaint  Patient presents with  . Acute Visit    Follow up on PNA    HPI:  Pt is a 83 y.o. female seen today for a follow up visit for PNA, hip fracture and incisional infection. Pt has been on antibiotics for PNA and incisional infection. Today, the incision is CDI. No drainage. No redness, no warmth. Minimal edema. O2 sats have improved. Pt reports she is feeling much better. No longer confused. Pt denies n/v/d/f/c/cp/sob/ha/abd pain/dizziness/cough. Pt continues with PT/OT. Pt reports pain is well controlled. VSS. No other complaints.    Past Medical History:  Diagnosis Date  . Hypertension   . Hypothyroidism   . Multiple sclerosis ([place])    Past Surgical History:  Procedure Laterality Date  . INTRAMEDULLARY (IM) NAIL INTERTROCHANTERIC Left 12/31/2016   Procedure: INTRAMEDULLARY (IM) NAIL INTERTROCHANTRIC;  Surgeon: Earnestine Leys, MD;  Location: [facility] ORS;  Service: Orthopedics;  Laterality: Left;  Marland Kitchen VITRECTOMY      No Known Allergies  Allergies as of 01/23/2017   No Known Allergies       Medication List       Accurate as of 01/23/17 11:59 PM. Always use your most recent med list.          acetaminophen 500 MG tablet Commonly known as:  TYLENOL Take 500 mg by mouth every 6 (six) hours as needed.   atenolol 50 MG tablet Commonly known as:  TENORMIN Take 50 mg by mouth daily.   cholecalciferol 1000 units tablet Commonly known as:  VITAMIN D Take 1,000 Units by mouth daily.   Cyanocobalamin 1000 MCG/ML Kit Inject 1 mL as directed every 30 (thirty) days. 1st of month at 6 am   ENSURE ENLIVE PO Take 1 Bottle by mouth 2 (two) times daily between meals.   feeding supplement (PRO-STAT SUGAR FREE 64) Liqd Take 30 mLs by mouth 2 (two) times daily between meals.   ferrous sulfate 325 (65 FE) MG tablet Take 325 mg by mouth 3 (three) times daily with meals.   HYDROcodone-acetaminophen 5-325 MG tablet Commonly known as:  NORCO/VICODIN Take 1 tablet by mouth 2 (two) times daily. Scheduled for pain. Ok to hold for sedation. Ok to give prn doses in addition to scheduled for severe pain.   HYDROcodone-acetaminophen 5-325 MG tablet Commonly known as:  NORCO/VICODIN Take 1 tablet by mouth every 6 (six) hours as needed for moderate pain.   levothyroxine 50 MCG tablet Commonly known as:  SYNTHROID, LEVOTHROID Take 50 mcg by mouth daily before breakfast.   magnesium hydroxide 400 MG/5ML suspension Commonly known as:  MILK OF MAGNESIA Take 30 mLs by mouth every 4 (four)  hours as needed for mild constipation. For constipation/ no BM for 2 days   OXYGEN Inhale 2 L into the lungs every 4 (four) hours as needed. Check o2 sats prior to placing on patient and at least every 4 hours after applying. Notify MD if oxygen sats drop below baseline while receiving oxygen or no improvement in dyspnea   vitamin C 500 MG tablet Commonly known as:  ASCORBIC ACID Take 500 mg by mouth 2 (two) times daily.       Review of Systems  Constitutional: Negative for activity change, appetite  change, chills, diaphoresis and fever.  HENT: Negative for congestion, sneezing, sore throat, trouble swallowing and voice change.   Respiratory: Negative for apnea, cough, choking, chest tightness, shortness of breath and wheezing.   Cardiovascular: Negative for chest pain, palpitations and leg swelling.  Gastrointestinal: Negative for abdominal distention, abdominal pain, constipation, diarrhea and nausea.  Genitourinary: Negative for difficulty urinating, dysuria, frequency and urgency.  Musculoskeletal: Positive for arthralgias (typical arthritis). Negative for back pain, gait problem and myalgias.  Skin: Positive for wound. Negative for color change, pallor and rash.  Neurological: Negative for dizziness, tremors, syncope, speech difficulty, weakness, numbness and headaches.  Psychiatric/Behavioral: Negative for agitation and behavioral problems.  All other systems reviewed and are negative.   Immunization History  Administered Date(s) Administered  . PPD Test 01/10/2017   Pertinent  Health Maintenance Due  Topic Date Due  . DEXA SCAN  05/15/1987  . PNA vac Low Risk Adult (1 of 2 - PCV13) 05/15/1987  . INFLUENZA VACCINE  01/24/2017   No flowsheet data found. Functional Status Survey:    Vitals:   01/23/17 1140  BP: 107/64  Pulse: 68  Resp: 16  Temp: 98.2 F (36.8 C)  SpO2: 93%  Weight: 114 lb 11.2 oz (52 kg)  Height: 5' (1.524 m)   Body mass index is 22.4 kg/m. Physical Exam  Constitutional: She is oriented to person, place, and time. Vital signs are normal. She appears well-developed and well-nourished. She is active and cooperative. She does not appear ill. No distress. Nasal cannula in place.  HENT:  Head: Normocephalic and atraumatic.  Mouth/Throat: Uvula is midline, oropharynx is clear and moist and mucous membranes are normal. Mucous membranes are not pale, not dry and not cyanotic.  Eyes: Pupils are equal, round, and reactive to light. Conjunctivae, EOM and  lids are normal.  Neck: Trachea normal, normal range of motion and full passive range of motion without pain. Neck supple. No JVD present. No tracheal deviation, no edema and no erythema present. No thyromegaly present.  Cardiovascular: Normal rate, regular rhythm, normal heart sounds, intact distal pulses and normal pulses.  Exam reveals no gallop, no distant heart sounds and no friction rub.   No murmur heard. Pulmonary/Chest: Effort normal. No accessory muscle usage. No respiratory distress. She has decreased breath sounds in the right lower field. She has no wheezes. She has no rhonchi. She has no rales. She exhibits no tenderness.  Abdominal: Normal appearance and bowel sounds are normal. She exhibits no distension and no ascites. There is no tenderness.  Musculoskeletal: She exhibits no edema or tenderness.       Left hip: She exhibits decreased range of motion, decreased strength and laceration.  Expected osteoarthritis, stiffness. Calves soft, supple. Negative Homan's sign  Neurological: She is alert and oriented to person, place, and time. She has normal strength.  Skin: Skin is warm and dry. Laceration (incision) noted. No rash noted. She  is not diaphoretic. No cyanosis or erythema. No pallor. Nails show no clubbing.  Psychiatric: She has a normal mood and affect. Her speech is normal and behavior is normal. Judgment and thought content normal. Cognition and memory are normal.  Nursing note and vitals reviewed.   Labs reviewed:  Recent Labs  01/02/17 0406 01/12/17 2350 01/16/17 2300  NA 132* 133* 138  K 4.2 4.2 4.7  CL 99* 97* 104  CO2 _0 GLUCOSE 127* 103* 102*  BUN 21* 42* 47*  CREATININE 0.90 1.74* 1.10*  CALCIUM 8.1* 7.8* 8.2*    Recent Labs  12/31/16 0418 01/12/17 2350 01/16/17 2300  AST 19 56* 22  ALT 11* 16 13*  ALKPHOS 46 70 85  BILITOT 0.7 0.9 0.5  PROT 6.0* 6.0* 6.6  ALBUMIN 3.2* 2.8* 2.7*    Recent Labs  12/30/16 1556  01/02/17 0406  01/12/17 2350 01/16/17 2300  WBC 6.0  < > 8.4 16.7* 8.6  NEUTROABS 3.8  --   --  14.4* 6.4  HGB 12.5  < > 9.5* 8.3* 8.1*  HCT 36.7  < > 27.2* 24.5* 24.5*  MCV 101.3*  < > 100.7* 101.8* 100.9*  PLT 245  < > 168 363 439  < > = values in this interval not displayed. Lab Results  Component Value Date   TSH 3.269 12/31/2016   No results found for: HGBA1C No results found for: CHOL, HDL, LDLCALC, LDLDIRECT, TRIG, CHOLHDL  Significant Diagnostic Results in last 30 days:  Dg Chest 1 View  Result Date: 12/30/2016 CLINICAL DATA:  Pain following fall EXAM: CHEST 1 VIEW COMPARISON:  None. FINDINGS: There is no edema or consolidation. Heart is mildly enlarged with pulmonary vascularity within normal limits. There is aortic atherosclerosis. No adenopathy. No pneumothorax. No evident bone lesions. IMPRESSION: Mild cardiomegaly. Aortic atherosclerosis. No edema or consolidation. Aortic Atherosclerosis (ICD10-I70.0). Electronically Signed   By: Lowella Grip III M.D.   On: 12/30/2016 16:42   Dg Hip Operative Unilat W Or W/o Pelvis Left  Result Date: 12/31/2016 CLINICAL DATA:  Intraoperative evaluation for left femoral neck fracture. EXAM: OPERATIVE left HIP (WITH PELVIS IF PERFORMED) 3 VIEWS TECHNIQUE: Fluoroscopic spot image(s) were submitted for interpretation post-operatively. COMPARISON:  12/30/2016 FINDINGS: 3 spot fluoro images show placement of a helical blade across a comminuted intertrochanteric femoral neck fracture. Bony alignment is improved compared to the pre reduction film. No evidence for immediate hardware complication. IMPRESSION: Improved alignment after ORIF for left femoral neck fracture. No evidence for immediate hardware complications. Electronically Signed   By: Misty Stanley M.D.   On: 12/31/2016 12:03   Dg Hip Unilat W Or Wo Pelvis 2-3 Views Left  Result Date: 12/30/2016 CLINICAL DATA:  Fall.  Left-sided hip pain. EXAM: DG HIP (WITH OR WITHOUT PELVIS) 2-3V LEFT COMPARISON:   None. FINDINGS: Frontal pelvis with AP and frog-leg lateral views of the left hip show a comminuted left intertrochanteric femoral neck fracture. Varus angulation associated. Bones are diffusely demineralized. IMPRESSION: Comminuted left intertrochanteric femoral neck fracture with varus angulation. Electronically Signed   By: Misty Stanley M.D.   On: 12/30/2016 16:45    Assessment/Plan 1. Pneumonia of right lower lobe due to infectious organism [facility])  Continue course of Levaquin until complete  Stable   2. Closed fracture of left hip with routine healing, subsequent encounter  Continue working with PT/OT  Continue exercises as taught by PT/OT  Continue Hydrocodone 5/325 mg po QID scheduled for pain  Encourage po  fluid intake  Encourage OOB to chair  Wound care per protocol  Pt now has self-pay sitter for safety  3. Incisional Infection  Resolved   Wound care per protocol  Family/ staff Communication:   Total Time:  Documentation:  Face to Face:  Family/Phone:   Labs/tests ordered:    Medication list reviewed and assessed for continued appropriateness.  Vikki Ports, NP-C Geriatrics [facility] [phone], [place] [address] Cell Phone (Mon-Fri 8am-5pm):  [phone] On Call:  [phone] & follow prompts after 5pm & weekends Office Phone:  [phone] Office Fax:  [phone]

## 2017-01-31 NOTE — Progress Notes (Signed)
Location:      Place of Service:  SNF (31) Provider:  Toni Arthurs, NP-C  Clemmie Krill Lynnell Jude, MD  Patient Care Team: Lynnell Jude, MD as PCP - General (Family Medicine)  Extended Emergency Contact Information Primary Emergency Contact: Reynaldo Minium [place] of [place] Phone: [phone] Relation: Son Secondary Emergency Contact: Sharene Butters [place] of [place] Phone: [phone] Mobile Phone: [phone] Relation: Son  Code Status:  Full Goals of care: Advanced Directive information Advanced Directives 01/24/2017  Does Patient Have a Medical Advance Directive? No  Type of Advance Directive -  Does patient want to make changes to medical advance directive? -  Copy of [place] in Chart? -  Would patient like information on creating a medical advance directive? -     Chief Complaint  Patient presents with  . Acute Visit    HPI:  Pt is a 83 y.o. female seen today for an acute visit for dyspnea, low sats. Staff reports pt c/o dyspnea/SOB. O2 sats 84% on RA. Nursing placed pt on O2 2L [place]. Sats increased to 95% on 2L. Breath sounds clear at that time. Pt afebrile. Pt having increased confusion. Nursing notified me, I gave verbal order for 2vCXR. This shows PNA. On exam, pt was lethargic, confused. Pt c/o generalized malaise. Pt says she has no specific complaints, just "don't feel good." pt denies n/v/d/f/c/cp/abd pain/dizziness/cough. Pt does also report frontal headache. Pt denies pain in the hip. Continues on antibiotic for incisional infection. VSS. No other complaints.    Past Medical History:  Diagnosis Date  . Hypertension   . Hypothyroidism   . Multiple sclerosis ([place])    Past Surgical History:  Procedure Laterality Date  . INTRAMEDULLARY (IM) NAIL INTERTROCHANTERIC Left 12/31/2016   Procedure: INTRAMEDULLARY (IM) NAIL INTERTROCHANTRIC;  Surgeon: Earnestine Leys, MD;  Location: [facility] ORS;  Service: Orthopedics;  Laterality:  Left;  Marland Kitchen VITRECTOMY      No Known Allergies  Allergies as of 01/19/2017   No Known Allergies     Medication List       Accurate as of 01/19/17 11:59 PM. Always use your most recent med list.          acetaminophen 500 MG tablet Commonly known as:  TYLENOL Take 500 mg by mouth every 6 (six) hours as needed.   atenolol 50 MG tablet Commonly known as:  TENORMIN Take 50 mg by mouth daily.   cephALEXin 500 MG capsule Commonly known as:  KEFLEX Take 500 mg by mouth 4 (four) times daily.   cholecalciferol 1000 units tablet Commonly known as:  VITAMIN D Take 1,000 Units by mouth daily.   Cyanocobalamin 1000 MCG/ML Kit Inject 1 mL as directed every 30 (thirty) days. 1st of month at 6 am   ENSURE ENLIVE PO Take 1 Bottle by mouth 2 (two) times daily between meals.   feeding supplement (PRO-STAT SUGAR FREE 64) Liqd Take 30 mLs by mouth 2 (two) times daily between meals.   ferrous sulfate 325 (65 FE) MG tablet Take 325 mg by mouth 3 (three) times daily with meals.   HYDROcodone-acetaminophen 5-325 MG tablet Commonly known as:  NORCO/VICODIN Take 1 tablet by mouth 2 (two) times daily. Scheduled for pain. Ok to hold for sedation. Ok to give prn doses in addition to scheduled for severe pain.   HYDROcodone-acetaminophen 5-325 MG tablet Commonly known as:  NORCO/VICODIN Take 1 tablet by mouth every 6 (six) hours as needed for moderate pain.  levothyroxine 50 MCG tablet Commonly known as:  SYNTHROID, LEVOTHROID Take 50 mcg by mouth daily before breakfast.   magnesium hydroxide 400 MG/5ML suspension Commonly known as:  MILK OF MAGNESIA Take 30 mLs by mouth every 4 (four) hours as needed for mild constipation. For constipation/ no BM for 2 days   vitamin C 500 MG tablet Commonly known as:  ASCORBIC ACID Take 500 mg by mouth 2 (two) times daily.       Review of Systems  Constitutional: Negative for activity change, appetite change, chills, diaphoresis and fever.    HENT: Negative for congestion, sneezing, sore throat, trouble swallowing and voice change.   Respiratory: Positive for shortness of breath. Negative for apnea, cough, choking, chest tightness and wheezing.   Cardiovascular: Negative for chest pain, palpitations and leg swelling.  Gastrointestinal: Negative for abdominal distention, abdominal pain, constipation, diarrhea and nausea.  Genitourinary: Negative for difficulty urinating, dysuria, frequency and urgency.  Musculoskeletal: Positive for arthralgias (typical arthritis). Negative for back pain, gait problem and myalgias.  Skin: Positive for wound. Negative for color change, pallor and rash.  Neurological: Positive for headaches. Negative for dizziness, tremors, syncope, speech difficulty, weakness and numbness.  Psychiatric/Behavioral: Negative for agitation and behavioral problems.  All other systems reviewed and are negative.   Immunization History  Administered Date(s) Administered  . PPD Test 01/10/2017   Pertinent  Health Maintenance Due  Topic Date Due  . DEXA SCAN  05/15/1987  . PNA vac Low Risk Adult (1 of 2 - PCV13) 05/15/1987  . INFLUENZA VACCINE  01/24/2017   No flowsheet data found. Functional Status Survey:    Vitals:   01/18/17 0745  BP: (!) 148/81  Pulse: 76  Resp: 20  Temp: 97.9 F (36.6 C)  SpO2: (!) 84%   There is no height or weight on file to calculate BMI. Physical Exam  Constitutional: She is oriented to person, place, and time. Vital signs are normal. She appears well-developed and well-nourished. She is active and cooperative. She does not appear ill. No distress. Nasal cannula in place.  HENT:  Head: Normocephalic and atraumatic.  Mouth/Throat: Uvula is midline, oropharynx is clear and moist and mucous membranes are normal. Mucous membranes are not pale, not dry and not cyanotic.  Eyes: Pupils are equal, round, and reactive to light. Conjunctivae, EOM and lids are normal.  Neck: Trachea  normal, normal range of motion and full passive range of motion without pain. Neck supple. No JVD present. No tracheal deviation, no edema and no erythema present. No thyromegaly present.  Cardiovascular: Normal rate, regular rhythm, normal heart sounds, intact distal pulses and normal pulses.  Exam reveals no gallop, no distant heart sounds and no friction rub.   No murmur heard. Pulmonary/Chest: Effort normal. No accessory muscle usage. No respiratory distress. She has decreased breath sounds in the right lower field. She has no wheezes. She has rhonchi in the right lower field. She has no rales. She exhibits no tenderness.  Abdominal: Normal appearance and bowel sounds are normal. She exhibits no distension and no ascites. There is no tenderness.  Musculoskeletal: She exhibits no edema or tenderness.       Left hip: She exhibits decreased range of motion, decreased strength and laceration.  Expected osteoarthritis, stiffness. Calves soft, supple. Negative Homan's sign  Neurological: She is alert and oriented to person, place, and time. She has normal strength.  Skin: Skin is warm and dry. Laceration (incision) noted. No rash noted. She is not diaphoretic. No  cyanosis or erythema. No pallor. Nails show no clubbing.  Psychiatric: She has a normal mood and affect. Her speech is normal and behavior is normal. Judgment and thought content normal. Cognition and memory are normal.  Nursing note and vitals reviewed.   Labs reviewed:  Recent Labs  01/02/17 0406 01/12/17 2350 01/16/17 2300  NA 132* 133* 138  K 4.2 4.2 4.7  CL 99* 97* 104  CO2 27 27 25   GLUCOSE 127* 103* 102*  BUN 21* 42* 47*  CREATININE 0.90 1.74* 1.10*  CALCIUM 8.1* 7.8* 8.2*    Recent Labs  12/31/16 0418 01/12/17 2350 01/16/17 2300  AST 19 56* 22  ALT 11* 16 13*  ALKPHOS 46 70 85  BILITOT 0.7 0.9 0.5  PROT 6.0* 6.0* 6.6  ALBUMIN 3.2* 2.8* 2.7*    Recent Labs  12/30/16 1556  01/02/17 0406 01/12/17 2350  01/16/17 2300  WBC 6.0  < > 8.4 16.7* 8.6  NEUTROABS 3.8  --   --  14.4* 6.4  HGB 12.5  < > 9.5* 8.3* 8.1*  HCT 36.7  < > 27.2* 24.5* 24.5*  MCV 101.3*  < > 100.7* 101.8* 100.9*  PLT 245  < > 168 363 439  < > = values in this interval not displayed. Lab Results  Component Value Date   TSH 3.269 12/31/2016   No results found for: HGBA1C No results found for: CHOL, HDL, LDLCALC, LDLDIRECT, TRIG, CHOLHDL  Significant Diagnostic Results in last 30 days:  No results found.  Assessment/Plan 1. Pneumonia of right lower lobe due to infectious organism (HCC)  Levaquin 750 mg po X 1  Levaquin 500 mg po Q Day x 6 days  Duonebs TID scheduled x 3 days  Family/ staff Communication:   Total Time:  Documentation:  Face to Face:  Family/Phone:   Labs/tests ordered:  2 view CXR,   Medication list reviewed and assessed for continued appropriateness.  Vikki Ports, NP-C Geriatrics [facility] [phone], [place] [address] Cell Phone (Mon-Fri 8am-5pm):  [phone] On Call:  [phone] & follow prompts after 5pm & weekends Office Phone:  [phone] Office Fax:  [phone]

## 2017-02-01 ENCOUNTER — Encounter: Payer: Self-pay | Admitting: Gerontology

## 2017-02-01 ENCOUNTER — Other Ambulatory Visit
Admission: RE | Admit: 2017-02-01 | Discharge: 2017-02-01 | Disposition: A | Discharge status: Home / Self Care | Payer: No Typology Code available for payment source | Source: Other Acute Inpatient Hospital | Attending: Gerontology | Admitting: Gerontology

## 2017-02-01 ENCOUNTER — Non-Acute Institutional Stay (SKILLED_NURSING_FACILITY): Payer: Medicare Other | Admitting: Gerontology

## 2017-02-01 DIAGNOSIS — M6281 Muscle weakness (generalized): Secondary | ICD-10-CM

## 2017-02-01 DIAGNOSIS — Z9181 History of falling: Secondary | ICD-10-CM | POA: Diagnosis not present

## 2017-02-01 DIAGNOSIS — E46 Unspecified protein-calorie malnutrition: Secondary | ICD-10-CM | POA: Diagnosis not present

## 2017-02-01 DIAGNOSIS — D649 Anemia, unspecified: Secondary | ICD-10-CM | POA: Diagnosis not present

## 2017-02-01 DIAGNOSIS — G35 Multiple sclerosis: Secondary | ICD-10-CM | POA: Diagnosis not present

## 2017-02-01 DIAGNOSIS — E538 Deficiency of other specified B group vitamins: Secondary | ICD-10-CM

## 2017-02-01 DIAGNOSIS — E039 Hypothyroidism, unspecified: Secondary | ICD-10-CM

## 2017-02-01 DIAGNOSIS — E559 Vitamin D deficiency, unspecified: Secondary | ICD-10-CM | POA: Diagnosis not present

## 2017-02-01 DIAGNOSIS — S72002D Fracture of unspecified part of neck of left femur, subsequent encounter for closed fracture with routine healing: Secondary | ICD-10-CM | POA: Diagnosis not present

## 2017-02-01 DIAGNOSIS — I10 Essential (primary) hypertension: Secondary | ICD-10-CM

## 2017-02-01 DIAGNOSIS — E871 Hypo-osmolality and hyponatremia: Secondary | ICD-10-CM | POA: Insufficient documentation

## 2017-02-01 LAB — CBC WITH DIFFERENTIAL/PLATELET
BASOS ABS: 0.1 10*3/uL (ref 0–0.1)
Basophils Relative: 1 %
EOS ABS: 0.3 10*3/uL (ref 0–0.7)
EOS PCT: 4 %
HCT: 29.9 % — ABNORMAL LOW (ref 35.0–47.0)
Hemoglobin: 9.8 g/dL — ABNORMAL LOW (ref 12.0–16.0)
LYMPHS PCT: 13 %
Lymphs Abs: 0.9 10*3/uL — ABNORMAL LOW (ref 1.0–3.6)
MCH: 32.9 pg (ref 26.0–34.0)
MCHC: 32.8 g/dL (ref 32.0–36.0)
MCV: 100.2 fL — AB (ref 80.0–100.0)
MONO ABS: 0.6 10*3/uL (ref 0.2–0.9)
Monocytes Relative: 9 %
Neutro Abs: 5 10*3/uL (ref 1.4–6.5)
Neutrophils Relative %: 73 %
PLATELETS: 276 10*3/uL (ref 150–440)
RBC: 2.99 MIL/uL — ABNORMAL LOW (ref 3.80–5.20)
RDW: 15.2 % — AB (ref 11.5–14.5)
WBC: 6.9 10*3/uL (ref 3.6–11.0)

## 2017-02-01 LAB — COMPREHENSIVE METABOLIC PANEL
ALT: 10 U/L — ABNORMAL LOW (ref 14–54)
AST: 18 U/L (ref 15–41)
Albumin: 3.3 g/dL — ABNORMAL LOW (ref 3.5–5.0)
Alkaline Phosphatase: 108 U/L (ref 38–126)
Anion gap: 11 (ref 5–15)
BUN: 36 mg/dL — ABNORMAL HIGH (ref 6–20)
CHLORIDE: 106 mmol/L (ref 101–111)
CO2: 26 mmol/L (ref 22–32)
Calcium: 8.8 mg/dL — ABNORMAL LOW (ref 8.9–10.3)
Creatinine, Ser: 0.89 mg/dL (ref 0.44–1.00)
GFR, EST NON AFRICAN AMERICAN: 54 mL/min — AB (ref >60)
Glucose, Bld: 88 mg/dL (ref 65–99)
POTASSIUM: 3.9 mmol/L (ref 3.5–5.1)
SODIUM: 143 mmol/L (ref 135–145)
Total Bilirubin: 0.8 mg/dL (ref 0.3–1.2)
Total Protein: 6.8 g/dL (ref 6.5–8.1)

## 2017-02-01 NOTE — Progress Notes (Signed)
Location:   [place] Room Number: [phone] Place of Service:  SNF [phone]) Provider:  Toni Arthurs, NP-C  Clemmie Krill Lynnell Jude, MD  Patient Care Team: Lynnell Jude, MD as PCP - General (Family Medicine)  Extended Emergency Contact Information Primary Emergency Contact: Reynaldo Minium [place] of [place] Phone: [phone] Relation: Son Secondary Emergency Contact: Sharene Butters States of [place] Phone: [phone] Mobile Phone: [phone] Relation: Son  Code Status:  FULL Goals of care: Advanced Directive information Advanced Directives 02/01/2017  Does Patient Have a Medical Advance Directive? No  Type of Advance Directive -  Does patient want to make changes to medical advance directive? -  Copy of [place] in Chart? -  Would patient like information on creating a medical advance directive? -     Chief Complaint  Patient presents with  . Medical Management of Chronic Issues    Routine Visit    HPI:  Pt is a 83 y.o. female seen today for follow up. Pt was admitted to the facility for rehab following hospitalization for left hip fracture with surgical intervention. Pt has been participating with PT and OT. Pt reports she is feeling well. She reports her appetite is good and is having regular BMs. Pt denies n/v/d/f/c/cp/sob/ha/abd pain/dizziness/cough. She reports her pain is well controlled at this point. Family decided to have pt stay on [facility] permanently as she is now needing more care than can be provided at home. Pt seems settled and content. VSS. No other complaints.     Past Medical History:  Diagnosis Date  . Hypertension   . Hypothyroidism   . Multiple sclerosis ([place])    Past Surgical History:  Procedure Laterality Date  . INTRAMEDULLARY (IM) NAIL INTERTROCHANTERIC Left 12/31/2016   Procedure: INTRAMEDULLARY (IM) NAIL INTERTROCHANTRIC;  Surgeon: Earnestine Leys, MD;  Location: [facility] ORS;  Service:  Orthopedics;  Laterality: Left;  Marland Kitchen VITRECTOMY      No Known Allergies  Allergies as of 02/01/2017   No Known Allergies     Medication List       Accurate as of 02/01/17  2:52 PM. Always use your most recent med list.          acetaminophen 500 MG tablet Commonly known as:  TYLENOL Take 500 mg by mouth every 6 (six) hours as needed.   atenolol 50 MG tablet Commonly known as:  TENORMIN Take 50 mg by mouth daily.   cholecalciferol 1000 units tablet Commonly known as:  VITAMIN D Take 1,000 Units by mouth daily.   Cyanocobalamin 1000 MCG/ML Kit Inject 1 mL as directed every 30 (thirty) days. 1st of month at 6 am   ENSURE ENLIVE PO Take 1 Bottle by mouth 2 (two) times daily between meals.   feeding supplement (PRO-STAT SUGAR FREE 64) Liqd Take 30 mLs by mouth 2 (two) times daily between meals.   ferrous sulfate 325 (65 FE) MG tablet Take 325 mg by mouth 3 (three) times daily with meals.   HYDROcodone-acetaminophen 5-325 MG tablet Commonly known as:  NORCO/VICODIN Take 1 tablet by mouth 2 (two) times daily. Scheduled for pain. Ok to hold for sedation. Ok to give prn doses in addition to scheduled for severe pain.   HYDROcodone-acetaminophen 5-325 MG tablet Commonly known as:  NORCO/VICODIN Take 1 tablet by mouth every 6 (six) hours as needed for moderate pain.   levothyroxine 50 MCG tablet Commonly known as:  SYNTHROID, LEVOTHROID Take 50 mcg by mouth  daily before breakfast.   magnesium hydroxide 400 MG/5ML suspension Commonly known as:  MILK OF MAGNESIA Take 30 mLs by mouth every 4 (four) hours as needed for mild constipation. For constipation/ no BM for 2 days   OXYGEN Inhale 2 L into the lungs every 4 (four) hours as needed. Check o2 sats prior to placing on patient and at least every 4 hours after applying. Notify MD if oxygen sats drop below baseline while receiving oxygen or no improvement in dyspnea   vitamin C 500 MG tablet Commonly known as:  ASCORBIC  ACID Take 500 mg by mouth 2 (two) times daily.       Review of Systems  Constitutional: Negative for activity change, appetite change, chills, diaphoresis and fever.  HENT: Negative for congestion, sneezing, sore throat, trouble swallowing and voice change.   Respiratory: Negative for apnea, cough, choking, chest tightness, shortness of breath and wheezing.   Cardiovascular: Negative for chest pain, palpitations and leg swelling.  Gastrointestinal: Negative for abdominal distention, abdominal pain, constipation, diarrhea and nausea.  Genitourinary: Negative for difficulty urinating, dysuria, frequency and urgency.  Musculoskeletal: Positive for arthralgias (typical arthritis). Negative for back pain, gait problem and myalgias.  Skin: Positive for wound. Negative for color change, pallor and rash.  Neurological: Negative for dizziness, tremors, syncope, speech difficulty, weakness, numbness and headaches.  Psychiatric/Behavioral: Negative for agitation and behavioral problems.  All other systems reviewed and are negative.   Immunization History  Administered Date(s) Administered  . PPD Test 01/02/2017   Pertinent  Health Maintenance Due  Topic Date Due  . DEXA SCAN  05/15/1987  . PNA vac Low Risk Adult (1 of 2 - PCV13) 05/15/1987  . INFLUENZA VACCINE  01/24/2017   No flowsheet data found. Functional Status Survey:    Vitals:   02/01/17 1443  BP: 113/61  Pulse: 67  Resp: 20  Temp: 98.8 F (37.1 C)  SpO2: 90%  Weight: 109 lb 1.6 oz (49.5 kg)  Height: 5' (1.524 m)   Body mass index is 21.31 kg/m. Physical Exam  Constitutional: She is oriented to person, place, and time. Vital signs are normal. She appears well-developed and well-nourished. She is active and cooperative. She does not appear ill. No distress.  HENT:  Head: Normocephalic and atraumatic.  Mouth/Throat: Uvula is midline, oropharynx is clear and moist and mucous membranes are normal. Mucous membranes are not  pale, not dry and not cyanotic.  Eyes: Pupils are equal, round, and reactive to light. Conjunctivae, EOM and lids are normal.  Neck: Trachea normal, normal range of motion and full passive range of motion without pain. Neck supple. No JVD present. No tracheal deviation, no edema and no erythema present. No thyromegaly present.  Cardiovascular: Normal rate, regular rhythm, normal heart sounds, intact distal pulses and normal pulses.  Exam reveals no gallop, no distant heart sounds and no friction rub.   No murmur heard. Pulmonary/Chest: Effort normal and breath sounds normal. No accessory muscle usage. No respiratory distress. She has no wheezes. She has no rales. She exhibits no tenderness.  Abdominal: Normal appearance and bowel sounds are normal. She exhibits no distension and no ascites. There is no tenderness.  Musculoskeletal: She exhibits no edema or tenderness.       Left hip: She exhibits decreased range of motion, decreased strength and laceration.  Expected osteoarthritis, stiffness. Calves soft, supple. Negative Homan's sign  Neurological: She is alert and oriented to person, place, and time. She has normal strength.  Skin: Skin  is warm and dry. Laceration (incision) noted. No rash noted. She is not diaphoretic. No cyanosis or erythema. No pallor. Nails show no clubbing.  Psychiatric: She has a normal mood and affect. Her speech is normal and behavior is normal. Judgment and thought content normal. Cognition and memory are normal.  Nursing note and vitals reviewed.   Labs reviewed:  Recent Labs  01/12/17 2350 01/16/17 2300 02/01/17 0625  NA 133* 138 143  K 4.2 4.7 3.9  CL 97* 104 106  CO2 27 25 26   GLUCOSE 103* 102* 88  BUN 42* 47* 36*  CREATININE 1.74* 1.10* 0.89  CALCIUM 7.8* 8.2* 8.8*    Recent Labs  01/12/17 2350 01/16/17 2300 02/01/17 0625  AST 56* 22 18  ALT 16 13* 10*  ALKPHOS 70 85 108  BILITOT 0.9 0.5 0.8  PROT 6.0* 6.6 6.8  ALBUMIN 2.8* 2.7* 3.3*     Recent Labs  01/12/17 2350 01/16/17 2300 02/01/17 0625  WBC 16.7* 8.6 6.9  NEUTROABS 14.4* 6.4 5.0  HGB 8.3* 8.1* 9.8*  HCT 24.5* 24.5* 29.9*  MCV 101.8* 100.9* 100.2*  PLT 363 439 276   Lab Results  Component Value Date   TSH 3.269 12/31/2016   No results found for: HGBA1C No results found for: CHOL, HDL, LDLCALC, LDLDIRECT, TRIG, CHOLHDL  Significant Diagnostic Results in last 30 days:  No results found.  Assessment/Plan 1. Multiple sclerosis (HCC)  Stable  Continue oxygen 2 L [place] prn  2. Hx of falling  Stable  3. Anemia, unspecified type  Stable  Continue Ferrous sulfate 325 mg po TID  Continue Vitamin C- 500 mg po BID for potentiation of iron absorption  4. Generalized muscle weakness  Stable  Mobile on unit with wheelchair  5. Hypothyroidism, unspecified type  Stable  Continue Levothyroxine 50 mcg po Q Day  6. Essential hypertension  Stable  Continue Atenolol 50 mg po Q Day  7. Vitamin D deficiency  Stable  Continue Cholecalciferol 1,000 mcg po Q Day  8. Vitamin B12 deficiency  Stable  Continue Cyanocobalamin 1,000 mcg po Q Day  9. Protein-calorie malnutrition, unspecified severity (HCC)  Stable  Continue Pro Stat 30 mL po BID  Continue Ensure Enlive 1 bottle po BID  10. Closed fracture of left hip with routine healing, subsequent encounter  Continue exercises as taught by PT/OT  Continue Hydrocodone 5/325 mg po BID scheduled for pain  Continue Hydrocodone 5/325 mg po Q 6 hours prn- breakthrough pain  Encourage po fluid intake  Encourage OOB to chair  Wound care per protocol  Pt now has self-pay sitter for safety  Family/ staff Communication:   Total Time:  Documentation:  Face to Face:  Family/Phone:   Labs/tests ordered:    Medication list reviewed and assessed for continued appropriateness. Monthly medication orders reviewed and signed.  Vikki Ports, NP-C Geriatrics [facility] [phone], [place] [address] Cell Phone (Mon-Fri 8am-5pm):  [phone] On Call:  [phone] & follow prompts after 5pm & weekends Office Phone:  [phone] Office Fax:  [phone]

## 2017-02-06 ENCOUNTER — Other Ambulatory Visit
Admission: RE | Admit: 2017-02-06 | Discharge: 2017-02-06 | Disposition: A | Discharge status: Home / Self Care | Payer: Medicare Other | Source: Ambulatory Visit | Attending: Internal Medicine | Admitting: Internal Medicine

## 2017-02-06 DIAGNOSIS — R410 Disorientation, unspecified: Secondary | ICD-10-CM | POA: Diagnosis present

## 2017-02-06 DIAGNOSIS — R451 Restlessness and agitation: Secondary | ICD-10-CM | POA: Insufficient documentation

## 2017-02-06 LAB — CBC WITH DIFFERENTIAL/PLATELET
Basophils Absolute: 0 10*3/uL (ref 0–0.1)
Basophils Relative: 1 %
EOS ABS: 0.2 10*3/uL (ref 0–0.7)
EOS PCT: 3 %
HCT: 29.9 % — ABNORMAL LOW (ref 35.0–47.0)
Hemoglobin: 9.8 g/dL — ABNORMAL LOW (ref 12.0–16.0)
LYMPHS ABS: 0.8 10*3/uL — AB (ref 1.0–3.6)
Lymphocytes Relative: 11 %
MCH: 33.3 pg (ref 26.0–34.0)
MCHC: 32.7 g/dL (ref 32.0–36.0)
MCV: 101.8 fL — ABNORMAL HIGH (ref 80.0–100.0)
MONO ABS: 0.6 10*3/uL (ref 0.2–0.9)
Monocytes Relative: 8 %
Neutro Abs: 5.6 10*3/uL (ref 1.4–6.5)
Neutrophils Relative %: 77 %
PLATELETS: 288 10*3/uL (ref 150–440)
RBC: 2.94 MIL/uL — AB (ref 3.80–5.20)
RDW: 15.4 % — AB (ref 11.5–14.5)
WBC: 7.3 10*3/uL (ref 3.6–11.0)

## 2017-02-06 LAB — URINALYSIS, COMPLETE (UACMP) WITH MICROSCOPIC
Bacteria, UA: NONE SEEN
Bilirubin Urine: NEGATIVE
GLUCOSE, UA: NEGATIVE mg/dL
Hgb urine dipstick: NEGATIVE
Ketones, ur: NEGATIVE mg/dL
Leukocytes, UA: NEGATIVE
Nitrite: NEGATIVE
PH: 6 (ref 5.0–8.0)
Protein, ur: 30 mg/dL — AB
SPECIFIC GRAVITY, URINE: 1.017 (ref 1.005–1.030)

## 2017-02-06 LAB — COMPREHENSIVE METABOLIC PANEL
ALT: 11 U/L — ABNORMAL LOW (ref 14–54)
ANION GAP: 10 (ref 5–15)
AST: 20 U/L (ref 15–41)
Albumin: 3.2 g/dL — ABNORMAL LOW (ref 3.5–5.0)
Alkaline Phosphatase: 95 U/L (ref 38–126)
BUN: 40 mg/dL — AB (ref 6–20)
CHLORIDE: 101 mmol/L (ref 101–111)
CO2: 25 mmol/L (ref 22–32)
Calcium: 8.5 mg/dL — ABNORMAL LOW (ref 8.9–10.3)
Creatinine, Ser: 1 mg/dL (ref 0.44–1.00)
GFR calc Af Amer: 54 mL/min — ABNORMAL LOW (ref >60)
GFR, EST NON AFRICAN AMERICAN: 47 mL/min — AB (ref >60)
Glucose, Bld: 118 mg/dL — ABNORMAL HIGH (ref 65–99)
POTASSIUM: 4.3 mmol/L (ref 3.5–5.1)
Sodium: 136 mmol/L (ref 135–145)
Total Bilirubin: 0.6 mg/dL (ref 0.3–1.2)
Total Protein: 6.7 g/dL (ref 6.5–8.1)

## 2017-02-08 LAB — URINE CULTURE: Culture: 40000 — AB

## 2017-02-18 DIAGNOSIS — Z9181 History of falling: Secondary | ICD-10-CM | POA: Insufficient documentation

## 2017-02-18 DIAGNOSIS — D649 Anemia, unspecified: Secondary | ICD-10-CM | POA: Insufficient documentation

## 2017-02-18 DIAGNOSIS — E538 Deficiency of other specified B group vitamins: Secondary | ICD-10-CM | POA: Insufficient documentation

## 2017-02-18 DIAGNOSIS — E46 Unspecified protein-calorie malnutrition: Secondary | ICD-10-CM | POA: Insufficient documentation

## 2017-02-18 DIAGNOSIS — E559 Vitamin D deficiency, unspecified: Secondary | ICD-10-CM | POA: Insufficient documentation

## 2017-02-18 DIAGNOSIS — M6281 Muscle weakness (generalized): Secondary | ICD-10-CM | POA: Insufficient documentation

## 2017-02-24 ENCOUNTER — Encounter
Admission: RE | Admit: 2017-02-24 | Discharge: 2017-02-24 | Disposition: A | Discharge status: Home / Self Care | Payer: Medicare Other | Source: Ambulatory Visit | Attending: Internal Medicine | Admitting: Internal Medicine

## 2017-02-24 DEATH — deceased

## 2018-04-26 IMAGING — CR DG CHEST 1V
1 series · 1 of 1 positions shown · non-contrast
Comparison: None.

CLINICAL DATA: Pain following fall

EXAM:
CHEST 1 VIEW

[chest ap]
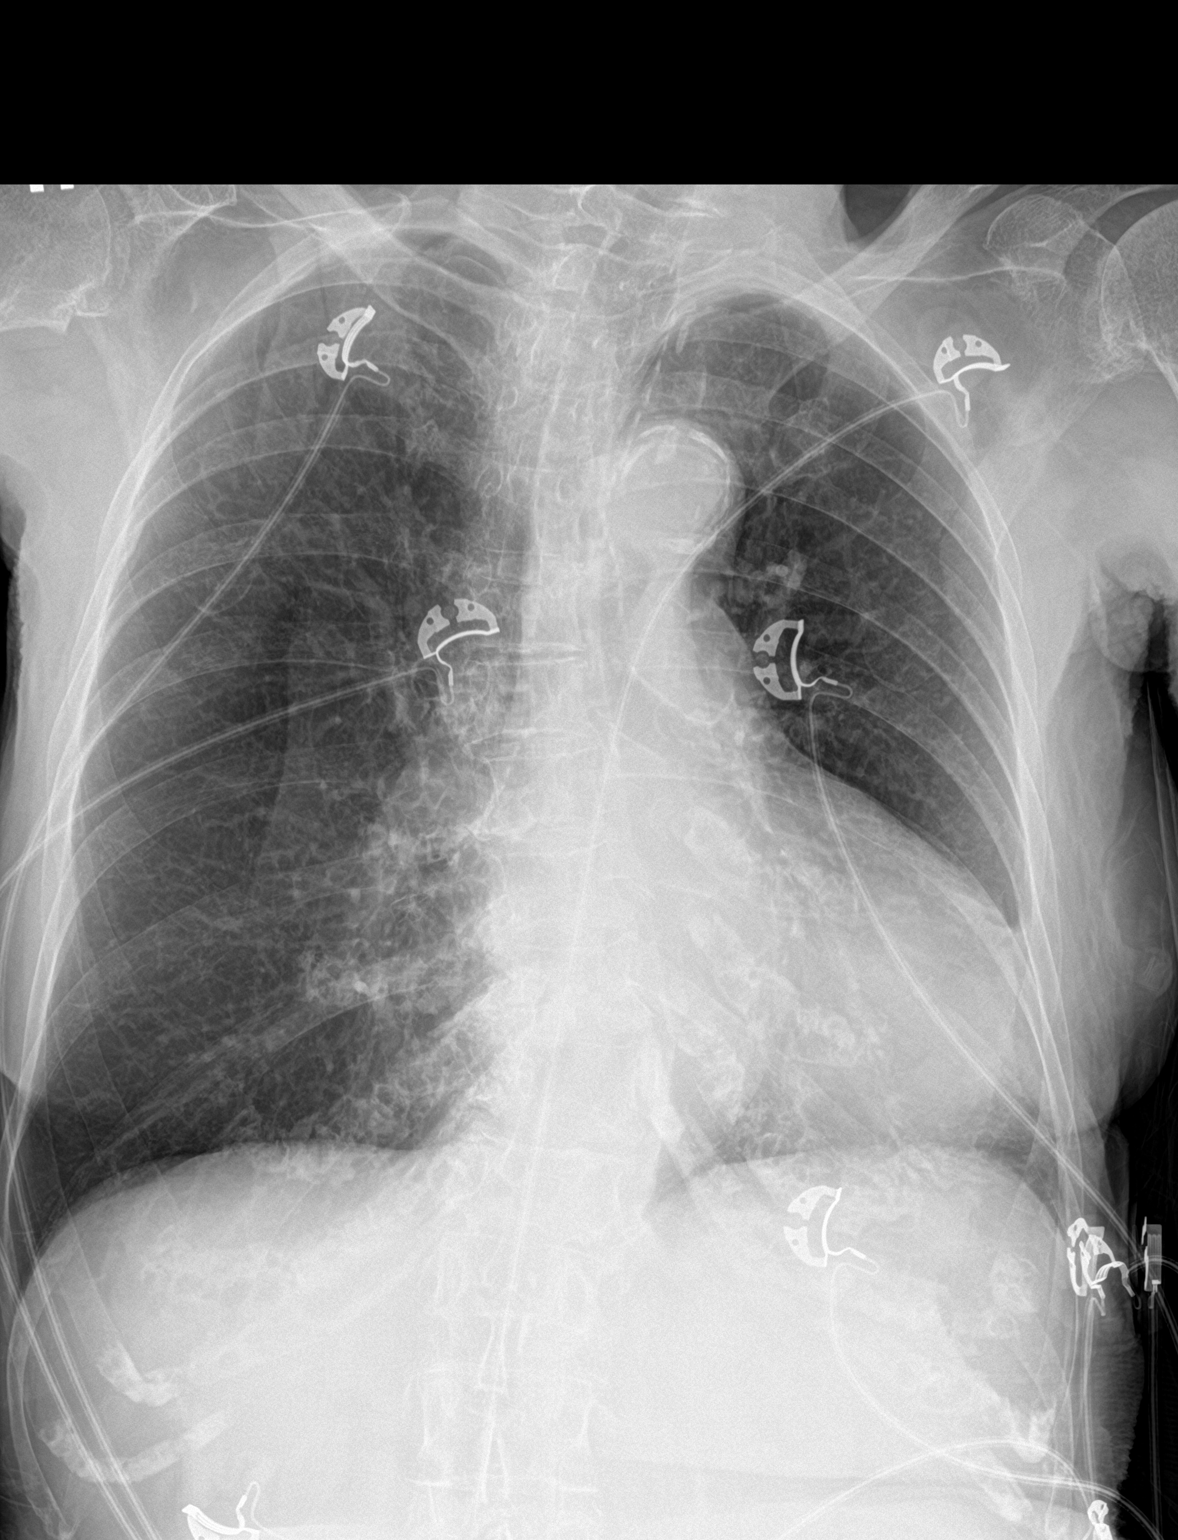

[1 of 1 positions shown; findings below may reference images not displayed]

FINDINGS: There is no edema or consolidation. Heart is mildly enlarged with
pulmonary vascularity within normal limits. There is aortic
atherosclerosis. No adenopathy. No pneumothorax. No evident bone
lesions.
IMPRESSION: Mild cardiomegaly. Aortic atherosclerosis. No edema or
consolidation.

Aortic Atherosclerosis ([id number]).

## 2018-04-27 IMAGING — RF DG HIP (WITH PELVIS) OPERATIVE*L*
1 series · 3 of 3 positions shown · non-contrast
Comparison: 12/30/2016

CLINICAL DATA: Intraoperative evaluation for left femoral neck
fracture.

EXAM:
OPERATIVE left HIP (WITH PELVIS IF PERFORMED) 3 VIEWS
TECHNIQUE: Fluoroscopic spot image(s) were submitted for interpretation
post-operatively.

[Series 1: run · 3 of 3 slices shown]
[im 1/3]
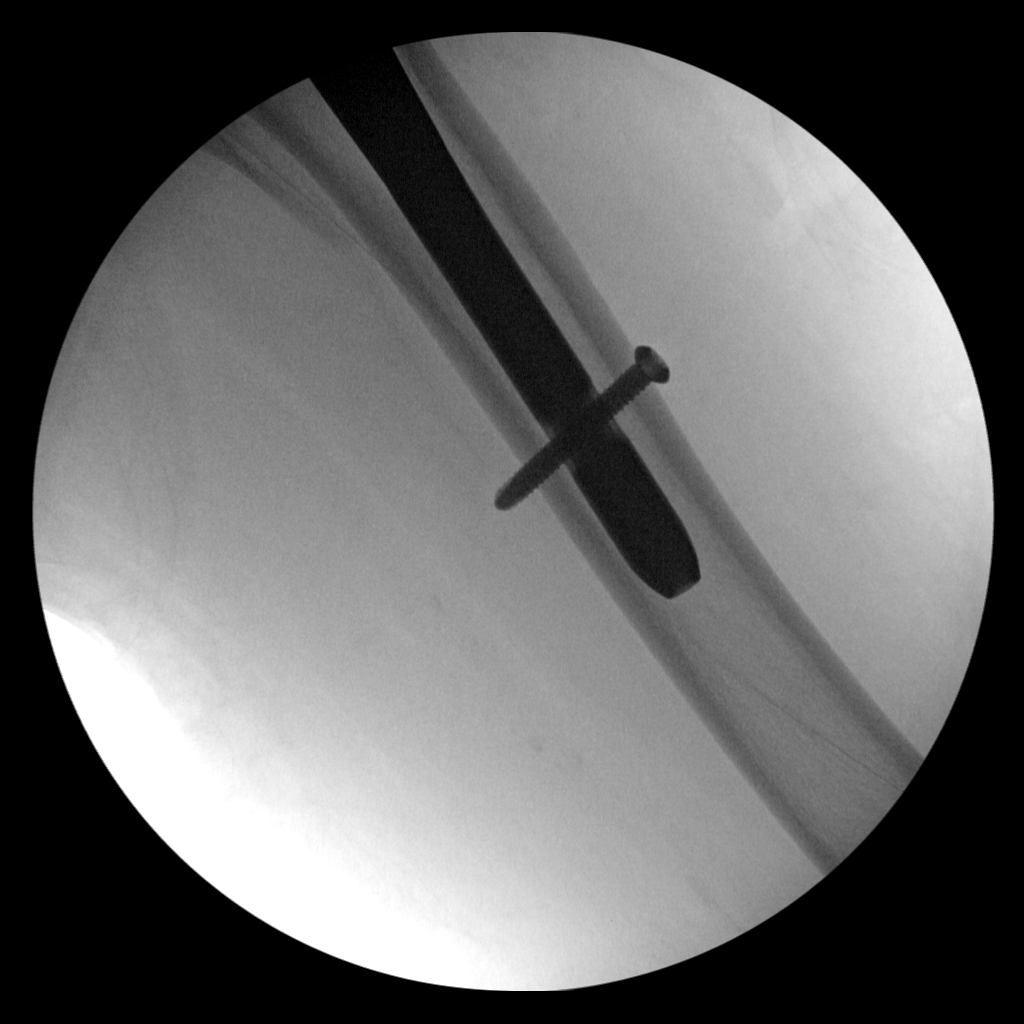
[im 2/3]
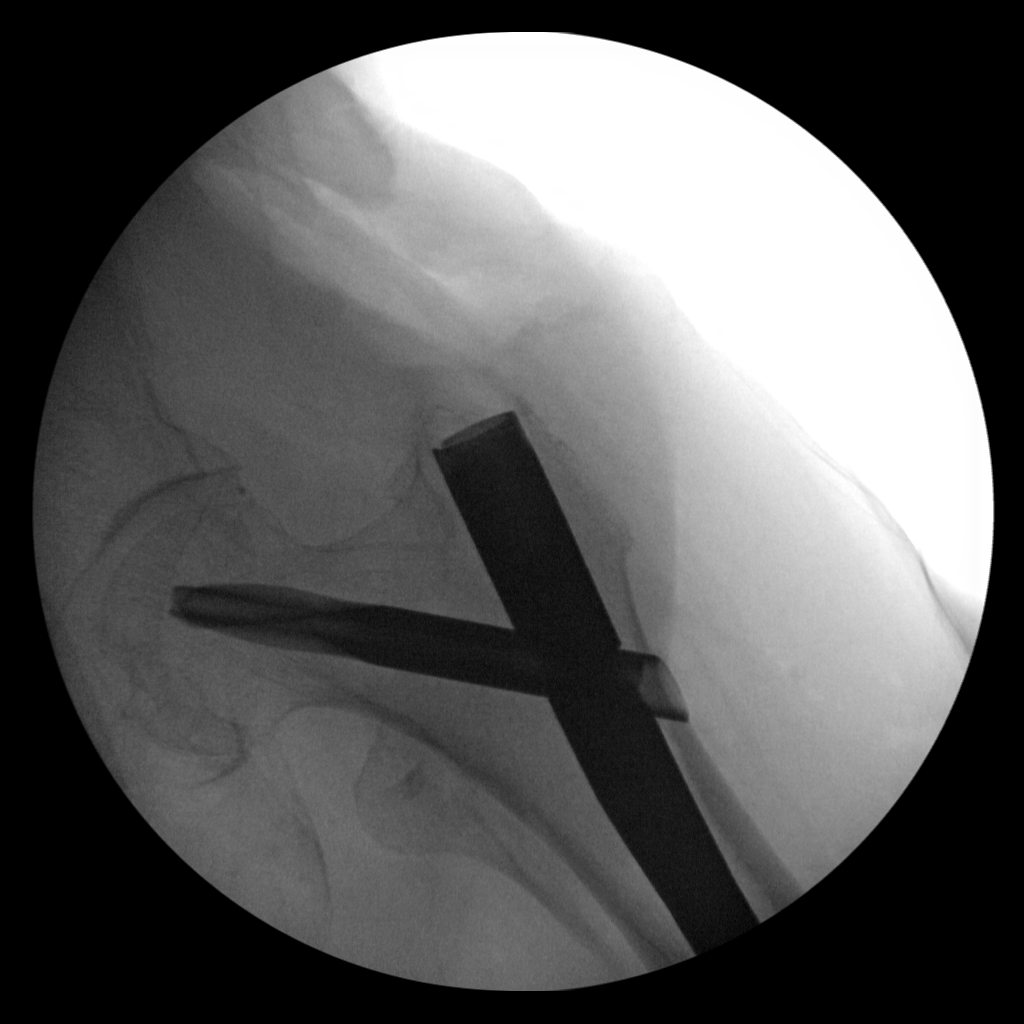
[im 3/3]
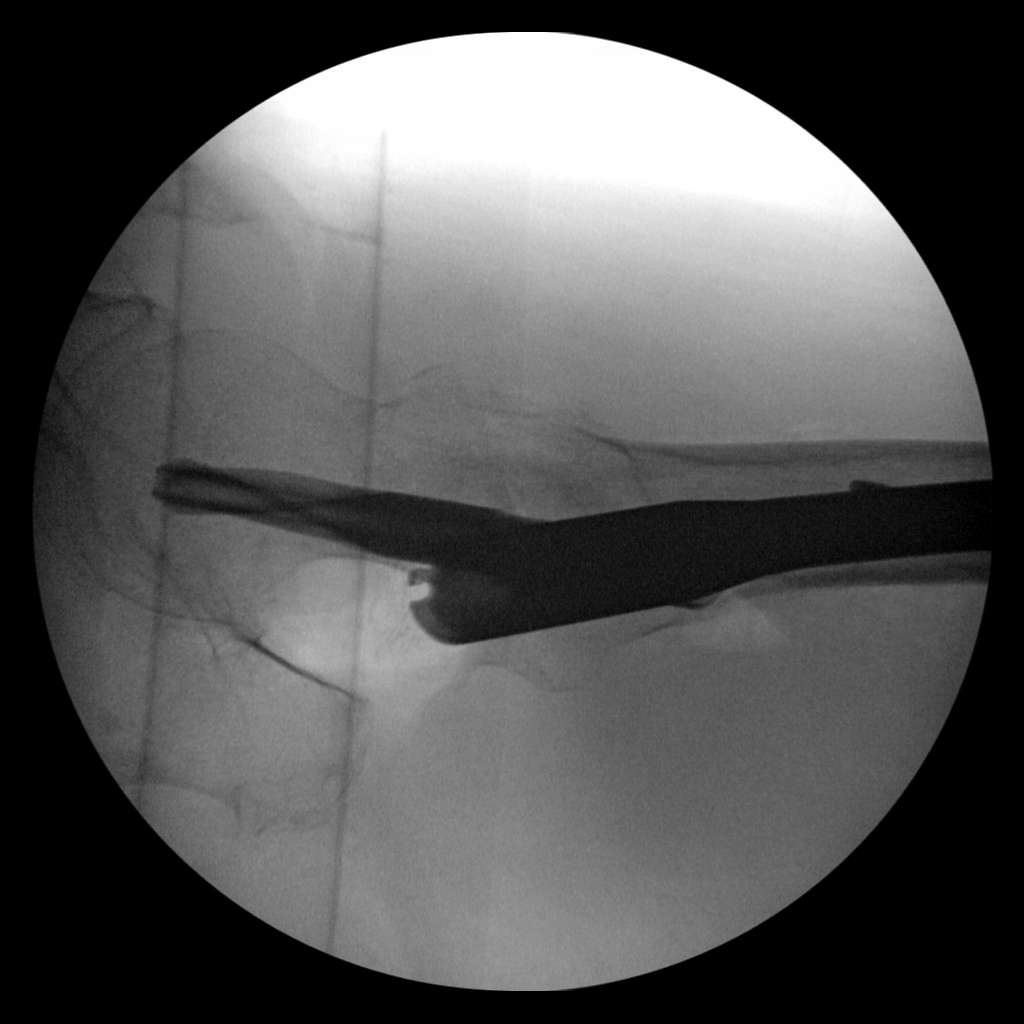

[3 of 3 positions shown; findings below may reference images not displayed]

FINDINGS: 3 spot fluoro images show placement of a helical blade across a
comminuted intertrochanteric femoral neck fracture. Bony alignment
is improved compared to the pre reduction film. No evidence for
immediate hardware complication.
IMPRESSION: Improved alignment after ORIF for left femoral neck fracture. No
evidence for immediate hardware complications.
